# Patient Record
Sex: Female | Born: 1980 | Race: White | Hispanic: No | Marital: Single | Smoking: Former smoker
Health system: Southern US, Community
[De-identification: ages and names within clinical notes are randomized; demographics above are authoritative.]

## PROBLEM LIST (undated history)

## (undated) DIAGNOSIS — L4050 Arthropathic psoriasis, unspecified: Secondary | ICD-10-CM

## (undated) DIAGNOSIS — Z3490 Encounter for supervision of normal pregnancy, unspecified, unspecified trimester: Secondary | ICD-10-CM

## (undated) DIAGNOSIS — G35 Multiple sclerosis: Secondary | ICD-10-CM

## (undated) DIAGNOSIS — E669 Obesity, unspecified: Secondary | ICD-10-CM

## (undated) DIAGNOSIS — E1165 Type 2 diabetes mellitus with hyperglycemia: Secondary | ICD-10-CM

## (undated) DIAGNOSIS — M2550 Pain in unspecified joint: Secondary | ICD-10-CM

## (undated) DIAGNOSIS — R42 Dizziness and giddiness: Secondary | ICD-10-CM

## (undated) DIAGNOSIS — O24419 Gestational diabetes mellitus in pregnancy, unspecified control: Secondary | ICD-10-CM

## (undated) DIAGNOSIS — O139 Gestational [pregnancy-induced] hypertension without significant proteinuria, unspecified trimester: Secondary | ICD-10-CM

## (undated) HISTORY — DX: Pain in unspecified joint: M25.50

## (undated) HISTORY — DX: Dizziness and giddiness: R42

## (undated) HISTORY — DX: Multiple sclerosis: G35

## (undated) HISTORY — DX: Type 2 diabetes mellitus with hyperglycemia: E11.65

## (undated) HISTORY — DX: Arthropathic psoriasis, unspecified: L40.50

---

## 2000-10-14 ENCOUNTER — Emergency Department (HOSPITAL_COMMUNITY): Admission: EM | Admit: 2000-10-14 | Discharge: 2000-10-14 | Payer: Self-pay | Admitting: Emergency Medicine

## 2001-07-03 ENCOUNTER — Emergency Department (HOSPITAL_COMMUNITY): Admission: EM | Admit: 2001-07-03 | Discharge: 2001-07-03 | Payer: Self-pay | Admitting: Emergency Medicine

## 2002-04-08 ENCOUNTER — Ambulatory Visit (HOSPITAL_COMMUNITY): Admission: RE | Admit: 2002-04-08 | Discharge: 2002-04-08 | Payer: Self-pay | Admitting: Obstetrics and Gynecology

## 2002-04-08 ENCOUNTER — Encounter: Payer: Self-pay | Admitting: Obstetrics and Gynecology

## 2002-04-23 ENCOUNTER — Ambulatory Visit (HOSPITAL_COMMUNITY): Admission: AD | Admit: 2002-04-23 | Discharge: 2002-04-23 | Payer: Self-pay | Admitting: Obstetrics and Gynecology

## 2002-04-29 ENCOUNTER — Ambulatory Visit (HOSPITAL_COMMUNITY): Admission: AD | Admit: 2002-04-29 | Discharge: 2002-04-29 | Payer: Self-pay | Admitting: Obstetrics and Gynecology

## 2002-05-03 ENCOUNTER — Ambulatory Visit (HOSPITAL_COMMUNITY): Admission: AD | Admit: 2002-05-03 | Discharge: 2002-05-03 | Payer: Self-pay | Admitting: Obstetrics and Gynecology

## 2002-05-06 ENCOUNTER — Ambulatory Visit (HOSPITAL_COMMUNITY): Admission: AD | Admit: 2002-05-06 | Discharge: 2002-05-06 | Payer: Self-pay | Admitting: Obstetrics and Gynecology

## 2002-05-11 ENCOUNTER — Inpatient Hospital Stay (HOSPITAL_COMMUNITY): Admission: AD | Admit: 2002-05-11 | Discharge: 2002-05-15 | Payer: Self-pay | Admitting: Obstetrics & Gynecology

## 2005-07-24 ENCOUNTER — Ambulatory Visit (HOSPITAL_COMMUNITY): Admission: RE | Admit: 2005-07-24 | Discharge: 2005-07-24 | Payer: Self-pay | Admitting: Family Medicine

## 2005-08-23 ENCOUNTER — Ambulatory Visit (HOSPITAL_COMMUNITY): Admission: RE | Admit: 2005-08-23 | Discharge: 2005-08-23 | Payer: Self-pay | Admitting: Family Medicine

## 2006-11-19 ENCOUNTER — Other Ambulatory Visit: Admission: RE | Admit: 2006-11-19 | Discharge: 2006-11-19 | Payer: Self-pay | Admitting: Obstetrics and Gynecology

## 2007-06-14 ENCOUNTER — Emergency Department (HOSPITAL_COMMUNITY): Admission: EM | Admit: 2007-06-14 | Discharge: 2007-06-14 | Payer: Self-pay | Admitting: Emergency Medicine

## 2008-08-14 ENCOUNTER — Emergency Department (HOSPITAL_COMMUNITY): Admission: EM | Admit: 2008-08-14 | Discharge: 2008-08-14 | Payer: Self-pay | Admitting: Emergency Medicine

## 2009-10-29 ENCOUNTER — Emergency Department (HOSPITAL_COMMUNITY): Admission: EM | Admit: 2009-10-29 | Discharge: 2009-10-29 | Payer: Self-pay | Admitting: Emergency Medicine

## 2009-10-31 ENCOUNTER — Emergency Department (HOSPITAL_COMMUNITY): Admission: EM | Admit: 2009-10-31 | Discharge: 2009-10-31 | Payer: Self-pay | Admitting: Emergency Medicine

## 2009-12-29 ENCOUNTER — Emergency Department (HOSPITAL_COMMUNITY): Admission: EM | Admit: 2009-12-29 | Discharge: 2009-12-29 | Payer: Self-pay | Admitting: Emergency Medicine

## 2010-03-11 ENCOUNTER — Emergency Department (HOSPITAL_COMMUNITY)
Admission: EM | Admit: 2010-03-11 | Discharge: 2010-03-11 | Payer: Self-pay | Source: Home / Self Care | Admitting: Emergency Medicine

## 2010-05-10 LAB — HEPATIC FUNCTION PANEL
ALT: 39 U/L — ABNORMAL HIGH (ref 0–35)
AST: 23 U/L (ref 0–37)
Albumin: 4.2 g/dL (ref 3.5–5.2)
Alkaline Phosphatase: 79 U/L (ref 39–117)
Total Bilirubin: 0.8 mg/dL (ref 0.3–1.2)

## 2010-05-10 LAB — URINALYSIS, ROUTINE W REFLEX MICROSCOPIC
Bilirubin Urine: NEGATIVE
Bilirubin Urine: NEGATIVE
Glucose, UA: NEGATIVE mg/dL
Glucose, UA: NEGATIVE mg/dL
Ketones, ur: 15 mg/dL — AB
Ketones, ur: NEGATIVE mg/dL
Nitrite: POSITIVE — AB
Nitrite: POSITIVE — AB
Protein, ur: 30 mg/dL — AB
Protein, ur: NEGATIVE mg/dL
Specific Gravity, Urine: 1.015 (ref 1.005–1.030)
Specific Gravity, Urine: 1.02 (ref 1.005–1.030)
Urobilinogen, UA: 0.2 mg/dL (ref 0.0–1.0)
Urobilinogen, UA: 1 mg/dL (ref 0.0–1.0)
pH: 6 (ref 5.0–8.0)
pH: 6.5 (ref 5.0–8.0)

## 2010-05-10 LAB — BASIC METABOLIC PANEL
Calcium: 9.5 mg/dL (ref 8.4–10.5)
GFR calc Af Amer: >60 mL/min (ref >60)
GFR calc non Af Amer: >60 mL/min (ref >60)
Potassium: 3.9 mEq/L (ref 3.5–5.1)
Sodium: 137 mEq/L (ref 135–145)

## 2010-05-10 LAB — URINE MICROSCOPIC-ADD ON

## 2010-05-10 LAB — URINE CULTURE

## 2010-05-10 LAB — PREGNANCY, URINE: Preg Test, Ur: NEGATIVE

## 2010-05-10 LAB — DIFFERENTIAL
Basophils Absolute: 0 10*3/uL (ref 0.0–0.1)
Basophils Relative: 0 % (ref 0–1)
Eosinophils Absolute: 0.1 10*3/uL (ref 0.0–0.7)
Eosinophils Relative: 1 % (ref 0–5)
Lymphocytes Relative: 16 % (ref 12–46)
Lymphs Abs: 2 10*3/uL (ref 0.7–4.0)
Monocytes Absolute: 0.8 10*3/uL (ref 0.1–1.0)
Monocytes Relative: 6 % (ref 3–12)
Neutro Abs: 9.3 10*3/uL — ABNORMAL HIGH (ref 1.7–7.7)
Neutrophils Relative %: 76 % (ref 43–77)

## 2010-05-10 LAB — CBC
HCT: 43.1 % (ref 36.0–46.0)
Hemoglobin: 14.6 g/dL (ref 12.0–15.0)
MCH: 30.3 pg (ref 26.0–34.0)
MCHC: 33.8 g/dL (ref 30.0–36.0)
MCV: 89.7 fL (ref 78.0–100.0)
Platelets: 293 10*3/uL (ref 150–400)
RBC: 4.81 MIL/uL (ref 3.87–5.11)
RDW: 13.4 % (ref 11.5–15.5)
WBC: 12.2 10*3/uL — ABNORMAL HIGH (ref 4.0–10.5)

## 2010-05-10 LAB — LIPASE, BLOOD: Lipase: 25 U/L (ref 11–59)

## 2010-07-13 NOTE — Discharge Summary (Signed)
   NAME:  Morgan Allen, Morgan Allen                           ACCOUNT NO.:  [id number]   MEDICAL RECORD NO.:  [id number]                   PATIENT TYPE:  INP   LOCATION:  A428                                 FACILITY:  [facility]   PHYSICIAN:  Tilda Burrow, M.D.              DATE OF BIRTH:  1980/07/20   DATE OF ADMISSION:  05/11/2002  DATE OF DISCHARGE:  05/15/2002                                 DISCHARGE SUMMARY   ADMITTING DIAGNOSES:  1. Intrauterine pregnancy [redacted] weeks gestation.  2. Chronic hypertension.   DISCHARGE DIAGNOSES:  1. Intrauterine pregnancy [redacted] weeks gestation, delivered.  2. Chronic hypertension.  3. Failed medical induction of labor.   PROCEDURE:  1. On 05/12/2002, Pitocin induction of labor, failed.  2. Primary low transverse cervical cesarean section by Lazaro Arms, M.D.   DISCHARGE MEDICATIONS:  1. Tylox 1-2 q.4h. p.r.n. pain, dispensed 30.  2. Motrin 800 mg 1 q.8h. p.r.n. pain, dispensed 30.   DISCHARGE INSTRUCTIONS:  Follow up in 5 days for staple removal.   HISTORY OF PRESENT ILLNESS:  This 30 year old female, gravida 1, para 0 was  admitted by Dr. Despina Hidden on 05/11/2002, for hypertension, with the cervix thick,  soft, and minus 2, soft in mid plane, 40 cm fundal height with a pregnancy  notable for chronic hypertension and morbid obesity, with the patient's  weight being 295 on a 5 foot 1 frame.  The infant had short femurs on  ultrasound just like the mother did.  Prenatal care was limited after a  February initial contact, establishment of prenatal care.   HOSPITAL COURSE:  The patient was admitted, had evaluation of the cervix  after Foley bulb ripening over night. The cervix was 3 cm and stayed that  way all day.  Disproportion, failed induction was diagnosed and he proceeded  with cesarean section at 5:30 p.m. delivering a healthy infant with Apgars  of 8 and 9.  Estimated blood loss was 1000 mL.   Postoperatively, the patient did excellently with prompt  ambulation.  Hemoglobin postoperatively was 9.0, hematocrit 26.4 consistent with the  suspected blood loss. This was compared to admitting hemoglobin of 12.7 and  hematocrit 37.0.  Maternal blood type is O positive.  Follow up will be in 5  days for staple removal and subsequently as necessary.                                               Tilda Burrow, M.D.    JVF/MEDQ  D:  05/15/2002  T:  05/16/2002  Job:  [id number]

## 2010-07-13 NOTE — H&P (Signed)
   NAME:  Morgan Allen, Morgan Allen                           ACCOUNT NO.:  [id number]   MEDICAL RECORD NO.:  [id number]                   PATIENT TYPE:  INP   LOCATION:  LDR4                                 FACILITY:  [facility]   PHYSICIAN:  Lazaro Arms, M.D.                DATE OF BIRTH:  1980/12/02   DATE OF ADMISSION:  05/11/2002  DATE OF DISCHARGE:                                HISTORY & PHYSICAL   HISTORY OF PRESENT ILLNESS:  The patient is a 30 year old white female,  gravida 1, para 0, abortus 0, with an estimated date of delivery of May 11, 2002, by her last menstrual period of August 03, 2001, and a confirmatory  ultrasound at [redacted] weeks gestation.  The patient was late presentation.  Her  first visit was at [redacted] weeks gestation.  She was found to be hypertension and  she was started on Aldomet 500 mg b.i.d.  She has no proteinuria.  Ultrasound in the office revealed short femurs and that was confirmed at  [facility].  There were no other specific anatomical abnormalities.  Because of her chronic hypertension, the patient is admitted for cervical  ripening with a Foley bulb  followed by Pitocin induction tomorrow.   PAST MEDICAL HISTORY:  Chronic hypertension.   PAST SURGICAL HISTORY:  Negative.   OBSTETRICAL HISTORY:  She is nulliparous.   ALLERGIES:  None.   MEDICATIONS:  Prenatal vitamins and the Aldomet as stated above.   FAMILY HISTORY:  Significant for diabetes and hypertension.   LABORATORY DATA:  Her blood type is O+.  Rubella is immune.  The antibody  screen is negative.  HIV, hepatitis B, and serology are all negative.  Her  Pap was normal.  GC and chlamydia were negative.  Her group B Streptococcus  was positive.  She has had twice weekly NSTs which have been normal.   PHYSICAL EXAMINATION:  HEENT:  Unremarkable.  NECK:  The thyroid is normal.  LUNGS:  Clear.  BREASTS:  Deferred.  ABDOMEN:  The fundal height is 40 cm.  PELVIC:  The cervix is fingertip, thick,  about a -2, soft, and midplane.  EXTREMITIES:  Warm with 1+ edema.   IMPRESSION:  1. Intrauterine pregnancy at [redacted] weeks gestation.  2. Chronic hypertension.  3. Morbid obesity.   PLAN:  The patient is admitted for Foley bulb ripening following Pitocin  induction.  She understands the indications and will proceed.                                               Lazaro Arms, M.D.    Loraine Maple  D:  05/11/2002  T:  05/11/2002  Job:  [id number]

## 2010-07-13 NOTE — Op Note (Signed)
NAME:  Morgan Allen, Morgan Allen                           ACCOUNT NO.:  [id number]   MEDICAL RECORD NO.:  [id number]                   PATIENT TYPE:  INP   LOCATION:  A428                                 FACILITY:  [facility]   PHYSICIAN:  Lazaro Arms, M.D.                DATE OF BIRTH:  [birth year]   DATE OF PROCEDURE:  05/12/2002  DATE OF DISCHARGE:                                 OPERATIVE REPORT   PREOPERATIVE DIAGNOSES:  1. Intrauterine pregnancy at [redacted] weeks gestation.  2. Chronic hypertension.  3. Failed induction.   POSTOPERATIVE DIAGNOSES:  1. Intrauterine pregnancy at [redacted] weeks gestation.  2. Chronic hypertension.  3. Failed induction.   PROCEDURE:  Primary low transverse cesarean section.   SURGEON:  Lazaro Arms, M.D.   ANESTHESIA:  Spinal.   FINDINGS:  The patient was admitted last night for induction because of  chronic hypertension at 40 weeks.  She underwent an amniotomy this morning.  She was 3 cm and stayed that all day.  She got 30 MU per minute of Pitocin.  Because of the dysfunctional nature of her labor and no change, it was  decided to proceed with a primary low transverse cesarean section.  She had  a spinal performed.  She delivered a viable female infant at 68 with  Apgars to be determined in the nursery.  Cord gas was drawn.  Cord blood was  drawn.  The weight was to be determined in the nursery as well.  The only  complication of the procedure was a scalp electrode in the IUPC were left  on, and they were removed at the time after the uterus was opened.  I  actually had to cut the scalp electrode and pulled a little nub of it off,  but we accounted for it.  The IUPC was taken out during the case.   DESCRIPTION OF PROCEDURE:  The patient was taken to the operating room and  placed in the sitting and was given spinal anesthetic.  She was then placed  in the supine position and prepped and draped in the usual sterile fashion.   A Pfannenstiel skin incision  was made and carried down sharply to the rectus  fascia which was scored in the midline and extended laterally.  The fascia  was taken off of the muscle superiorly and inferiorly without difficulty.  The muscles were divided.  The peritoneal cavity was entered.  The bladder  blade was placed.  The vesicouterine serosal flap was created.  A low  transverse hysterotomy incision was made, and over this incision was  delivered a viable female infant at 62 with weight and Apgars to be  determined in the nursery.  Dr. Tillman Abide was in attendance for routine neonatal  resuscitation.  The placenta was delivered spontaneously.  The uterus was  somewhat boggy.  After delivery, she required Methergine.  It was  firmed up  by the end of the case.  She did have more blood loss, not from the uterine  incision but from the uterus itself due to this fact.  The uterus was closed  in two layers, the first being a running interlocking layer and the second  being an embrocating layer.  There was good hemostasis of the uterine  incision.  The pelvis was irrigated vigorously using warm normal saline.  All pedicles were found to be hemostatic.  The muscles were reapproximated  loosely.  The fascia was closed using 0 Vicryl running.  The subcutaneous  tissue was irrigated and made hemostatic.  She received Ancef  prophylactically.  The skin was closed using skin staples.  The patient  tolerated the procedure well.  She experienced 1000 cc of blood loss and was  taken to the recovery room in good and stable condition.  All counts were  correct x3.                                               Lazaro Arms, M.D.    Loraine Maple  D:  05/12/2002  T:  05/13/2002  Job:  [id number]

## 2014-02-25 NOTE — L&D Delivery Note (Signed)
Operative Delivery Note At 8:40 AM a non-viable female was delivered via VBAC, Spontaneous.  Presentation: vertex; Position: Right,, Occiput,, Anterior.   Delivery of the head: 09/18/2014  8:10 AM. CNM attempted delivery via downward traction of head, but was unsuccessful after 3 attempts. Dx shoulder dystocia.   First maneuver: 09/18/2014  8:15 AM, Suprapubic Pressure, McRoberts performed w/ CNM. CNM attempted delivery of posterior arm, but was unable to reach. Attending called. [name] came to [facility]. Attempted delivery of posterior arm. Unable. Dr. Debroah Loop called. Second maneuver: , Posterior arm delivered By [name] MD. Fundal pressure applied by CNM. Second arm delivered. Body delivered slowly w/ difficulty.  Third maneuver: 09/18/2014  8:38 AM,    APGAR: 0, 0; weight 8 lb 9 oz .   Placenta status: Intact, Spontaneous.   Cord: 3 vessels with the following complications: None.  Cord pH: NA  Anesthesia: Epidural  Episiotomy: None Lacerations: None Suture Repair: NA Est. Blood Loss (mL): 150  Mom to postpartum.  Baby to [place]. Family undecided about autopsy.   Morgan Allen 09/18/2014, 9:34 AM

## 2014-03-18 LAB — OB RESULTS CONSOLE HGB/HCT, BLOOD
HCT: 39 %
HEMOGLOBIN: 13.4 g/dL

## 2014-03-18 LAB — US OB LIMITED

## 2014-03-18 LAB — OB RESULTS CONSOLE HEPATITIS B SURFACE ANTIGEN: Hepatitis B Surface Ag: NEGATIVE

## 2014-03-18 LAB — OB RESULTS CONSOLE ABO/RH: RH Type: POSITIVE

## 2014-03-18 LAB — OB RESULTS CONSOLE RPR: RPR: NONREACTIVE

## 2014-03-18 LAB — OB RESULTS CONSOLE ANTIBODY SCREEN: ANTIBODY SCREEN: NEGATIVE

## 2014-03-18 LAB — OB RESULTS CONSOLE PLATELET COUNT: Platelets: 273 10*3/uL

## 2014-03-18 LAB — SICKLE CELL SCREEN: SICKLE CELL SCREEN: NEGATIVE

## 2014-03-18 LAB — OB RESULTS CONSOLE RUBELLA ANTIBODY, IGM: RUBELLA: IMMUNE

## 2014-03-21 LAB — HEMOGLOBIN A1C: HEMOGLOBIN A1C: 6.4

## 2014-03-21 LAB — CYTOLOGY - PAP
HPV DNA: NOT DETECTED
PAP SMEAR: NEGATIVE

## 2014-04-08 LAB — US OB FOLLOW UP

## 2014-05-12 ENCOUNTER — Emergency Department (HOSPITAL_COMMUNITY)
Admission: EM | Admit: 2014-05-12 | Discharge: 2014-05-12 | Disposition: A | Discharge status: Home / Self Care | Payer: BLUE CROSS/BLUE SHIELD | Attending: Emergency Medicine | Admitting: Emergency Medicine

## 2014-05-12 ENCOUNTER — Encounter (HOSPITAL_COMMUNITY): Payer: Self-pay

## 2014-05-12 DIAGNOSIS — Z9889 Other specified postprocedural states: Secondary | ICD-10-CM | POA: Insufficient documentation

## 2014-05-12 DIAGNOSIS — O99350 Diseases of the nervous system complicating pregnancy, unspecified trimester: Secondary | ICD-10-CM | POA: Insufficient documentation

## 2014-05-12 DIAGNOSIS — M79602 Pain in left arm: Secondary | ICD-10-CM | POA: Insufficient documentation

## 2014-05-12 DIAGNOSIS — Z3A00 Weeks of gestation of pregnancy not specified: Secondary | ICD-10-CM | POA: Diagnosis not present

## 2014-05-12 DIAGNOSIS — O9989 Other specified diseases and conditions complicating pregnancy, childbirth and the puerperium: Secondary | ICD-10-CM | POA: Diagnosis present

## 2014-05-12 DIAGNOSIS — G629 Polyneuropathy, unspecified: Secondary | ICD-10-CM | POA: Diagnosis not present

## 2014-05-12 DIAGNOSIS — Z87891 Personal history of nicotine dependence: Secondary | ICD-10-CM | POA: Diagnosis not present

## 2014-05-12 DIAGNOSIS — Z79899 Other long term (current) drug therapy: Secondary | ICD-10-CM | POA: Insufficient documentation

## 2014-05-12 HISTORY — DX: Encounter for supervision of normal pregnancy, unspecified, unspecified trimester: Z34.90

## 2014-05-12 NOTE — ED Provider Notes (Signed)
CSN: [id number]     Arrival date & time 05/12/14  3244 History   First MD Initiated Contact with Patient 05/12/14 [phone]     Chief Complaint  Patient presents with  . Arm Pain     (Consider location/radiation/quality/duration/timing/severity/associated sxs/prior Treatment) Patient is a 34 y.o. female presenting with arm pain. The history is provided by the patient.  Arm Pain Pertinent negatives include no chest pain, no abdominal pain, no headaches and no shortness of breath.   patient is pregnant. Due date is in August. Patient followed by OB/GYN in [place]. Patient with complaint of one-week history of intermittent left arm numbness that doesn't involve just the ulnar nerve but seems to involve median and radial nerve as well. Ends to occur while sleeping. Upon awakening it resolves over 10 minutes. Also same thing occurs with the left femoral nerve distribution. No other of neuro focal deficits nothing permanent. No visual changes. No significant headaches.  Past Medical History  Diagnosis Date  . Pregnant    Past Surgical History  Procedure Laterality Date  . Cesarean section     No family history on file. History  Substance Use Topics  . Smoking status: Former Games developer  . Smokeless tobacco: Not on file  . Alcohol Use: No   OB History    Gravida Para Term Preterm AB TAB SAB Ectopic Multiple Living   1              Review of Systems  Constitutional: Negative for fever.  HENT: Negative for congestion.   Eyes: Negative for visual disturbance.  Respiratory: Negative for shortness of breath.   Cardiovascular: Negative for chest pain.  Gastrointestinal: Negative for abdominal pain.  Genitourinary: Negative for dysuria.  Musculoskeletal: Negative for back pain and neck pain.  Skin: Negative for rash.  Neurological: Positive for numbness. Negative for dizziness, facial asymmetry, speech difficulty, weakness and headaches.  Hematological: Does not bruise/bleed easily.   Psychiatric/Behavioral: Negative for confusion.      Allergies  Review of patient's allergies indicates no known allergies.  Home Medications   Prior to Admission medications   Medication Sig Start Date End Date Taking? Authorizing Provider  Prenatal Vit-Fe Fumarate-FA (PRENATAL MULTIVITAMIN) TABS tablet Take 1 tablet by mouth daily at 12 noon.   Yes Historical Provider, MD   BP 146/94 mmHg  Pulse 96  Temp(Src) 97.9 F (36.6 C)  Resp 16  Ht 5\' 2"  (1.575 m)  Wt 297 lb (134.718 kg)  BMI 54.31 kg/m2  SpO2 100%  LMP 12/29/2013 Physical Exam  Constitutional: She is oriented to person, place, and time. She appears well-developed and well-nourished. No distress.  HENT:  Head: Normocephalic and atraumatic.  Mouth/Throat: Oropharynx is clear and moist.  Eyes: Conjunctivae and EOM are normal. Pupils are equal, round, and reactive to light.  Neck: Normal range of motion.  Cardiovascular: Normal rate, regular rhythm and normal heart sounds.   Pulmonary/Chest: Effort normal and breath sounds normal. No respiratory distress.  Abdominal: Soft. Bowel sounds are normal. There is no tenderness.  Musculoskeletal: Normal range of motion.  Neurological: She is alert and oriented to person, place, and time. No cranial nerve deficit. She exhibits normal muscle tone. Coordination normal.  Skin: Skin is warm. No rash noted.  Nursing note and vitals reviewed.   ED Course  Procedures (including critical care time) Labs Review Labs Reviewed - No data to display  Imaging Review No results found.   EKG Interpretation None      MDM  Final diagnoses:  Neuropathy    Symptoms more consistent with a neuropraxia versus neuropathy. Doubt that it's MS related. With unusual as it with the left arm it seems to involve all 3 nerves. But does recover about 10 minutes upon awakening. May be related to pregnancy. Patient will follow-up with her OB/GYN and discussed the situation with them in  addition the referral to neurology provided. No concerns for stroke. Symptoms are transient and tendo occur with sleep. Also involves the left femoral nerve and the median ulnar radial nerve of the left hand. No motor weakness. Work note provided.  Patient's blood pressure is somewhat elevated. Do not think that this is a preeclampsia picture. Patient's due date is August 12. Which is not putting her into the window of a preeclampsia picture. Close follow-up of blood pressure will be important.    Vanetta Mulders, MD 05/12/14 [phone]

## 2014-05-12 NOTE — Discharge Instructions (Signed)
Has we discussed follow-up with your OB/GYN they may have an explanation. In addition referral to neurology provided. Sounds as if there is nerve compression to all 3 nerves in the left arm which may be occurring at the brachial plexus level. The left leg seems to be more of a femoral nerve problem. Return for any new or worse symptoms.

## 2014-05-12 NOTE — ED Notes (Signed)
Pain in left arm from elbow to fingers with some numbness to fingertips, states happens more often when she is lying down, has been worse this am.

## 2014-06-07 ENCOUNTER — Other Ambulatory Visit (HOSPITAL_COMMUNITY): Payer: Self-pay | Admitting: Obstetrics & Gynecology

## 2014-06-07 DIAGNOSIS — Z0489 Encounter for examination and observation for other specified reasons: Secondary | ICD-10-CM

## 2014-06-07 DIAGNOSIS — O09522 Supervision of elderly multigravida, second trimester: Secondary | ICD-10-CM

## 2014-06-07 DIAGNOSIS — IMO0002 Reserved for concepts with insufficient information to code with codable children: Secondary | ICD-10-CM

## 2014-06-10 ENCOUNTER — Ambulatory Visit (HOSPITAL_COMMUNITY)
Admission: RE | Admit: 2014-06-10 | Discharge: 2014-06-10 | Disposition: A | Discharge status: Home / Self Care | Payer: BLUE CROSS/BLUE SHIELD | Source: Ambulatory Visit | Attending: Obstetrics & Gynecology | Admitting: Obstetrics & Gynecology

## 2014-06-10 ENCOUNTER — Other Ambulatory Visit (HOSPITAL_COMMUNITY): Payer: Self-pay | Admitting: Obstetrics & Gynecology

## 2014-06-10 ENCOUNTER — Other Ambulatory Visit (HOSPITAL_COMMUNITY): Payer: Self-pay | Admitting: Maternal and Fetal Medicine

## 2014-06-10 ENCOUNTER — Encounter (HOSPITAL_COMMUNITY): Payer: Self-pay

## 2014-06-10 DIAGNOSIS — O09522 Supervision of elderly multigravida, second trimester: Secondary | ICD-10-CM

## 2014-06-10 DIAGNOSIS — Z315 Encounter for genetic counseling: Secondary | ICD-10-CM | POA: Diagnosis not present

## 2014-06-10 DIAGNOSIS — Z3A22 22 weeks gestation of pregnancy: Secondary | ICD-10-CM | POA: Diagnosis not present

## 2014-06-10 DIAGNOSIS — Z0489 Encounter for examination and observation for other specified reasons: Secondary | ICD-10-CM

## 2014-06-10 DIAGNOSIS — O09529 Supervision of elderly multigravida, unspecified trimester: Secondary | ICD-10-CM | POA: Insufficient documentation

## 2014-06-10 DIAGNOSIS — Z3689 Encounter for other specified antenatal screening: Secondary | ICD-10-CM | POA: Insufficient documentation

## 2014-06-10 DIAGNOSIS — IMO0002 Reserved for concepts with insufficient information to code with codable children: Secondary | ICD-10-CM

## 2014-06-10 NOTE — Progress Notes (Signed)
Genetic Counseling  High-Risk Gestation Note  Appointment Date:  06/10/2014 Referred By: Kingsley Plan, MD Date of Birth:  June 21, 1980 Partner:  Morgan Allen   Pregnancy History: [id number] Estimated Date of Delivery: 10/11/14 Estimated Gestational Age: [redacted]w[redacted]d Attending: Alpha Gula, MD  [name] and her partner, Morgan Allen, were seen for genetic counseling because of a maternal age of 34 y.o..  She will be 34 years old at delivery.   She was counseled regarding maternal age and the association with risk for chromosome conditions due to nondisjunction with aging of the ova.   We reviewed chromosomes, nondisjunction, and the associated 1 in 141 risk for fetal aneuploidy related to a maternal age of 34 y.o. at [redacted]w[redacted]d gestation.  They were counseled that the risk for aneuploidy decreases as gestational age increases, accounting for those pregnancies which spontaneously abort.  We specifically discussed Down syndrome (trisomy 55), trisomies 45 and 20, and sex chromosome aneuploidies (47,XXX and 47,XXY) including the common features and prognoses of each.   Morgan Allen previously had Quad screen performed through her OB office, which was within normal limits for the conditions screened. We reviewed the reduced risks for fetal Down syndrome (1 in 7127), trisomy 18, and ONTDs (1 in 6916). We reviewed that screening tests are used to modify a patient's a priori risk for aneuploidy, typically based on age. This provides a pregnancy specific risk assessment but is not diagnostic.   We reviewed additional available screening options including noninvasive prenatal screening (NIPS)/cell free DNA (cfDNA) testing and detailed ultrasound.  We reviewed the benefits and limitations of each option. Specifically, we discussed the conditions for which each test screens, the detection rates, and false positive rates of each. They were also counseled regarding diagnostic testing via amniocentesis. We reviewed  the approximate 1 in 300-500 risk for complications for amniocentesis, including spontaneous pregnancy loss. After consideration of all the options, they elected to proceed with targeted ultrasound only. The patient declined NIPS and amniocentesis in the pregnancy.    A complete ultrasound was performed today. The ultrasound report will be sent under separate cover. There were no visualized fetal anomalies or markers suggestive of aneuploidy. They understand that screening tests cannot rule out all birth defects or genetic syndromes. The patient was advised of this limitation and states she still does not want additional testing at this time. Follow-up ultrasound was scheduled for 07/08/14 to complete fetal anatomic survey.   Morgan Allen was provided with written information regarding cystic fibrosis (CF) including the carrier frequency and incidence in the Caucasian population, the availability of carrier testing and prenatal diagnosis if indicated.  In addition, we discussed that CF is routinely screened for as part of the [place] newborn screening panel.  She previously had CF carrier screening, which was negative for the 97 mutations analyzed. Thus, her risk to be a CF carrier has been reduced.   Both family histories were reviewed and found to be noncontributory for birth defects, intellectual disability, and known genetic conditions. Without further information regarding the provided family history, an accurate genetic risk cannot be calculated. Further genetic counseling is warranted if more information is obtained.  Morgan Allen denied exposure to environmental toxins or chemical agents. She denied the use of alcohol, tobacco or street drugs. She denied significant viral illnesses during the course of her pregnancy. Her medical and surgical histories were noncontributory.   I counseled this couple regarding the above risks and available options.  The approximate face-to-face  time with the genetic  counselor was 30 minutes.  Morgan Plowman, MS,  Certified Genetic Counselor 06/10/2014

## 2014-07-08 ENCOUNTER — Other Ambulatory Visit (HOSPITAL_COMMUNITY): Payer: Self-pay | Admitting: Maternal and Fetal Medicine

## 2014-07-08 ENCOUNTER — Ambulatory Visit (HOSPITAL_COMMUNITY)
Admission: RE | Admit: 2014-07-08 | Discharge: 2014-07-08 | Disposition: A | Discharge status: Home / Self Care | Payer: BLUE CROSS/BLUE SHIELD | Source: Ambulatory Visit | Attending: Obstetrics & Gynecology | Admitting: Obstetrics & Gynecology

## 2014-07-08 ENCOUNTER — Encounter (HOSPITAL_COMMUNITY): Payer: Self-pay

## 2014-07-08 DIAGNOSIS — E119 Type 2 diabetes mellitus without complications: Secondary | ICD-10-CM | POA: Diagnosis not present

## 2014-07-08 DIAGNOSIS — Z3A26 26 weeks gestation of pregnancy: Secondary | ICD-10-CM | POA: Insufficient documentation

## 2014-07-08 DIAGNOSIS — O99212 Obesity complicating pregnancy, second trimester: Secondary | ICD-10-CM | POA: Insufficient documentation

## 2014-07-08 DIAGNOSIS — O10012 Pre-existing essential hypertension complicating pregnancy, second trimester: Secondary | ICD-10-CM | POA: Insufficient documentation

## 2014-07-08 DIAGNOSIS — O09522 Supervision of elderly multigravida, second trimester: Secondary | ICD-10-CM

## 2014-07-08 DIAGNOSIS — O24112 Pre-existing diabetes mellitus, type 2, in pregnancy, second trimester: Secondary | ICD-10-CM | POA: Insufficient documentation

## 2014-07-08 HISTORY — DX: Gestational diabetes mellitus in pregnancy, unspecified control: O24.419

## 2014-07-08 NOTE — ED Notes (Signed)
Prescription for Metformin  BID and Glyburide  BID called to [facility] in [place], [place].

## 2014-07-08 NOTE — ED Notes (Signed)
Blood sugar log copied, scanned to chart and reviewed by Dr. Claudean Severance.

## 2014-07-08 NOTE — Consult Note (Signed)
Maternal Fetal Medicine Consultation  Requesting Provider(s): Kingsley Plan, MD  Reason for consultation: Pre-existing type 2 diabetes, chronic hypertension  HPI: Morgan Allen is a 34 yo G2P1001, EDD 10/11/2014 who is currently at [redacted]w[redacted]d seen for consultation today due to a history of pre-existing type 2 diabetes.  Morgan Allen started her OB care in [place] and was previously followed by MFM there.  Morgan Allen reports a history of type 2 diabetes that was first diagnosed in 9.  At the time of her diagnosis, she had a Hemoglobin A1C of 13%.  She was initially treated with insulin - after weight loss and dietary interventions, her blood sugars were much better controlled and she no longer required insulin.  Her initial HbA1C in [place] was 6.4% and she did not previously require therapy.  Morgan Allen has been following her fingerstick values - over the last week, essentially all her values have been outside of target range with fasting values of 120, 128, 126, and 119.  All her post prandial values are elevated in the 140-220 range.  Morgan Allen did not have baseline labs performed and does not recall the last time that she had an evaluation of her eye-grounds.  She is without complaints today.  The fetus is active.  OB History: OB History    Gravida Para Term Preterm AB TAB SAB Ectopic Multiple Living   Previous C-section for arrest of dilation  PMH:  Past Medical History  Diagnosis Date  . Pregnant   . Gestational diabetes   . Hypertension     PSH:  Past Surgical History  Procedure Laterality Date  . Cesarean section     Meds:  Current Outpatient Prescriptions on File Prior to Encounter  Medication Sig Dispense Refill  . acetaminophen (TYLENOL) 325 MG tablet Take 650 mg by mouth every 6 (six) hours as needed.    . BusPIRone HCl (BUSPAR PO) Take by mouth.    . Prenatal Vit-Fe Fumarate-FA (PRENATAL MULTIVITAMIN) TABS tablet Take 1 tablet by mouth daily at 12 noon.      No current facility-administered medications on file prior to encounter.   Allergies: No Known Allergies   FH: Father - diabetes; Mother - sclerodema.  Denies birth defects or hereditary disorders  Soc:  Denies tobacco or ETOH use   Review of Systems: no vaginal bleeding or cramping/contractions, no LOF, no nausea/vomiting. All other systems reviewed and are negative.   PE:  300 lbs, 130/77, 98  GEN: well-appearing female ABD: gravid, NT  Ultrasound: Single IUP at 26w 3d Obesity, CHTN, pre-existing type 2 diabetes The estimated fetal weight today is at the 89th %tile.  The AC measures > 97th %tile. Limited views of the fetal heart and cranial anatomy  were again obtained due to poor ultrasound penetration. Anterior placenta without previa Normal amniotic fluid volume  A/P: 1) Single IUP at 26w 3d  2) Pre-existing type 2 diabetes - fingerstick values all elevated.  Ultrasound findings suggestive of diabetes (large AC, estimated fetal weight at the 89th %tile).  My recommendation is to start insulin therapy as now.  The patient reported that her insurance will not cover insulin- her co-pay is high and that she is unable to afford insulin therapy.  Because of this, I am willing to give a trial of oral hypoglycemic agents for 1-2 weeks, but feel that she will ultimately need to start insulin.  Will begin Metformin 500  mg BID and Glyburide 5 mg BID.  The patient will return to meet with our diabetic educator in 1-2 weeks.  Will review her fingerstick values at that time and start insulin then if needed.  3) Chronic hypertension  4) Obesity  Recommendations: 1) Will begin oral agents now (see above), but I am not optimistic that she will be able to achieve blood sugars in the desired range with these agents alone. 2) Recommend HbA1C, 24-hr urine protein with creatine clearance, and CMP to be drawn at her next OB follow up visit. 3) Optho exam to evaluate eye-grounds as baseline  evaluation was never completed 4) Fetal echo due to pre-gestational diabetes - scheduled 5) Ultrasound for growth in 4 weeks- patient to return in 4 weeks for follow up growth and to complete anatomy 6) Diabetic education (scheduled) 7) Recommend antenatal testing beginning at 32 weeks - 2x weekly NSTs with weekly AFIs. 8) Serial growth ultrasounds every 4 weeks due to diabetes   Thank you for the opportunity to be a part of the care of Morgan Allen. Please contact our office if we can be of further assistance.   I spent approximately 30 minutes with this patient with over 50% of time spent in face-to-face counseling.  Alpha Gula, MD Maternal Fetal Medicine

## 2014-07-12 ENCOUNTER — Other Ambulatory Visit (HOSPITAL_COMMUNITY): Payer: Self-pay | Admitting: Obstetrics & Gynecology

## 2014-07-12 ENCOUNTER — Encounter (HOSPITAL_COMMUNITY): Payer: Self-pay | Admitting: Obstetrics & Gynecology

## 2014-07-14 ENCOUNTER — Ambulatory Visit (HOSPITAL_COMMUNITY): Payer: BLUE CROSS/BLUE SHIELD

## 2014-07-19 ENCOUNTER — Other Ambulatory Visit (HOSPITAL_COMMUNITY): Payer: Self-pay | Admitting: Obstetrics & Gynecology

## 2014-07-27 ENCOUNTER — Encounter (HOSPITAL_COMMUNITY): Payer: Self-pay | Admitting: Obstetrics & Gynecology

## 2014-07-27 ENCOUNTER — Other Ambulatory Visit (HOSPITAL_COMMUNITY): Payer: Self-pay | Admitting: Obstetrics & Gynecology

## 2014-08-05 ENCOUNTER — Ambulatory Visit (HOSPITAL_COMMUNITY): Payer: BLUE CROSS/BLUE SHIELD | Attending: Obstetrics & Gynecology

## 2014-08-08 ENCOUNTER — Telehealth (HOSPITAL_COMMUNITY): Payer: Self-pay | Admitting: *Deleted

## 2014-08-08 NOTE — Telephone Encounter (Signed)
Pt called requesting a refill for diabetic medications.  Returned call to pt.  Pt at lunch, will attempt to call pt again.

## 2014-08-11 ENCOUNTER — Telehealth (HOSPITAL_COMMUNITY): Payer: Self-pay | Admitting: *Deleted

## 2014-08-11 NOTE — Telephone Encounter (Signed)
Attempted to call pt regarding request for refill of diabetic medications.  Pt not available at number given [phone]).  Left message for pt to call back.

## 2014-08-16 ENCOUNTER — Ambulatory Visit (HOSPITAL_COMMUNITY): Payer: BLUE CROSS/BLUE SHIELD

## 2014-08-23 ENCOUNTER — Observation Stay (HOSPITAL_COMMUNITY)
Admission: AD | Admit: 2014-08-23 | Discharge: 2014-08-23 | Discharge status: Against Medical Advice | Payer: BLUE CROSS/BLUE SHIELD | Source: Ambulatory Visit | Attending: Family Medicine | Admitting: Family Medicine

## 2014-08-23 ENCOUNTER — Other Ambulatory Visit (HOSPITAL_COMMUNITY): Payer: Self-pay | Admitting: Maternal and Fetal Medicine

## 2014-08-23 ENCOUNTER — Encounter (HOSPITAL_COMMUNITY): Payer: Self-pay | Admitting: Family Medicine

## 2014-08-23 ENCOUNTER — Encounter (HOSPITAL_COMMUNITY): Payer: Self-pay

## 2014-08-23 ENCOUNTER — Encounter (HOSPITAL_COMMUNITY): Payer: Self-pay | Admitting: *Deleted

## 2014-08-23 ENCOUNTER — Ambulatory Visit (HOSPITAL_COMMUNITY)
Admission: RE | Admit: 2014-08-23 | Discharge: 2014-08-23 | Disposition: A | Discharge status: Home / Self Care | Payer: BLUE CROSS/BLUE SHIELD | Source: Ambulatory Visit | Attending: Obstetrics & Gynecology | Admitting: Obstetrics & Gynecology

## 2014-08-23 DIAGNOSIS — O24119 Pre-existing diabetes mellitus, type 2, in pregnancy, unspecified trimester: Secondary | ICD-10-CM | POA: Insufficient documentation

## 2014-08-23 DIAGNOSIS — Z3A34 34 weeks gestation of pregnancy: Secondary | ICD-10-CM

## 2014-08-23 DIAGNOSIS — O139 Gestational [pregnancy-induced] hypertension without significant proteinuria, unspecified trimester: Secondary | ICD-10-CM | POA: Diagnosis present

## 2014-08-23 DIAGNOSIS — O09522 Supervision of elderly multigravida, second trimester: Secondary | ICD-10-CM

## 2014-08-23 DIAGNOSIS — O133 Gestational [pregnancy-induced] hypertension without significant proteinuria, third trimester: Secondary | ICD-10-CM

## 2014-08-23 DIAGNOSIS — O169 Unspecified maternal hypertension, unspecified trimester: Principal | ICD-10-CM | POA: Insufficient documentation

## 2014-08-23 DIAGNOSIS — Z3A33 33 weeks gestation of pregnancy: Secondary | ICD-10-CM | POA: Insufficient documentation

## 2014-08-23 HISTORY — DX: Obesity, unspecified: E66.9

## 2014-08-23 HISTORY — DX: Gestational (pregnancy-induced) hypertension without significant proteinuria, unspecified trimester: O13.9

## 2014-08-23 LAB — CBC
HEMATOCRIT: 39.5 % (ref 36.0–46.0)
HEMOGLOBIN: 14.2 g/dL (ref 12.0–15.0)
MCH: 30.9 pg (ref 26.0–34.0)
MCHC: 35.9 g/dL (ref 30.0–36.0)
MCV: 85.9 fL (ref 78.0–100.0)
Platelets: 337 10*3/uL (ref 150–400)
RBC: 4.6 MIL/uL (ref 3.87–5.11)
RDW: 12.8 % (ref 11.5–15.5)
WBC: 11.2 10*3/uL — ABNORMAL HIGH (ref 4.0–10.5)

## 2014-08-23 LAB — COMPREHENSIVE METABOLIC PANEL
ALT: 24 U/L (ref 14–54)
ANION GAP: 5 (ref 5–15)
AST: 26 U/L (ref 15–41)
Albumin: 2.6 g/dL — ABNORMAL LOW (ref 3.5–5.0)
Alkaline Phosphatase: 114 U/L (ref 38–126)
BILIRUBIN TOTAL: 0.2 mg/dL — AB (ref 0.3–1.2)
BUN: 15 mg/dL (ref 6–20)
CALCIUM: 8.9 mg/dL (ref 8.9–10.3)
CO2: 19 mmol/L — AB (ref 22–32)
Chloride: 112 mmol/L — ABNORMAL HIGH (ref 101–111)
Creatinine, Ser: 0.89 mg/dL (ref 0.44–1.00)
GFR calc non Af Amer: >60 mL/min (ref >60)
Glucose, Bld: 62 mg/dL — ABNORMAL LOW (ref 65–99)
POTASSIUM: 3.8 mmol/L (ref 3.5–5.1)
SODIUM: 136 mmol/L (ref 135–145)
Total Protein: 6.9 g/dL (ref 6.5–8.1)

## 2014-08-23 LAB — PROTEIN / CREATININE RATIO, URINE
Creatinine, Urine: 242 mg/dL
PROTEIN CREATININE RATIO: 0.29 mg/mg{creat} — AB (ref 0.00–0.15)
TOTAL PROTEIN, URINE: 69 mg/dL

## 2014-08-23 MED ORDER — ACETAMINOPHEN 325 MG PO TABS: 650.0000 mg | ORAL_TABLET | Freq: Four times a day (QID) | ORAL | Status: DC | PRN

## 2014-08-23 MED ORDER — DOCUSATE SODIUM 100 MG PO CAPS: 100.0000 mg | ORAL_CAPSULE | Freq: Every day | ORAL | Status: DC

## 2014-08-23 MED ORDER — ZOLPIDEM TARTRATE 5 MG PO TABS: 5.0000 mg | ORAL_TABLET | Freq: Every evening | ORAL | Status: DC | PRN

## 2014-08-23 MED ORDER — LABETALOL HCL 5 MG/ML IV SOLN: 20.0000 mg | INTRAVENOUS | Status: DC | PRN

## 2014-08-23 MED ORDER — METFORMIN HCL 500 MG PO TABS: 500.0000 mg | ORAL_TABLET | Freq: Two times a day (BID) | ORAL | Status: DC

## 2014-08-23 MED ORDER — HYDRALAZINE HCL 20 MG/ML IJ SOLN: 10.0000 mg | Freq: Once | INTRAMUSCULAR | Status: DC | PRN

## 2014-08-23 MED ORDER — PRENATAL MULTIVITAMIN CH: 1.0000 | ORAL_TABLET | Freq: Every day | ORAL | Status: DC

## 2014-08-23 MED ORDER — ACETAMINOPHEN 325 MG PO TABS: 650.0000 mg | ORAL_TABLET | ORAL | Status: DC | PRN

## 2014-08-23 MED ORDER — CYCLOBENZAPRINE HCL 5 MG PO TABS: 5.0000 mg | ORAL_TABLET | Freq: Three times a day (TID) | ORAL | Status: DC | PRN

## 2014-08-23 MED ORDER — CALCIUM CARBONATE ANTACID 500 MG PO CHEW: 2.0000 | CHEWABLE_TABLET | ORAL | Status: DC | PRN

## 2014-08-23 MED ORDER — GLYBURIDE 5 MG PO TABS: 5.0000 mg | ORAL_TABLET | Freq: Two times a day (BID) | ORAL | Status: DC

## 2014-08-23 NOTE — ED Notes (Signed)
Pt did not bring her blood sugars to today's appointment.  She states her sugars have been good.

## 2014-08-23 NOTE — MAU Note (Signed)
Pt signed out AMA.  She states she cannot stay to be admitted because she will be fired.  States she already has an appointment in the [facility].

## 2014-08-23 NOTE — MAU Note (Signed)
Pt sent from MFM for elevated BP.  Pt gets PNC in [place].  Pt denies HA, visual changes.  Has L mid abd pain.

## 2014-08-23 NOTE — ED Notes (Signed)
Report called to [facility], RN.  Pt to MAU for evaluation.

## 2014-08-23 NOTE — Discharge Instructions (Signed)

## 2014-08-23 NOTE — MAU Provider Note (Signed)
History     CSN: [id number]  Arrival date and time: 08/23/14 1022   First Provider Initiated Contact with Patient 08/23/14 1113      Chief Complaint  Patient presents with  . Hypertension   HPI  Patient is 34 y.o. G2P1001 [redacted]w[redacted]d here referred from MFM 2/2 elevated blood pressures.  Gets care in [place], was seen in [place] earlier today for ultrasound and for suspected uncontrolled DM, has polyhydramnios on sono  +FM, denies LOF, VB, contractions, vaginal discharge.    Past Medical History  Diagnosis Date  . Pregnant   . Gestational diabetes   . Pregnancy induced hypertension   . Obesity     Past Surgical History  Procedure Laterality Date  . Cesarean section      History reviewed. No pertinent family history.  History  Substance Use Topics  . Smoking status: Former Research scientist (life sciences)  . Smokeless tobacco: Not on file  . Alcohol Use: No    Allergies: No Known Allergies  Prescriptions prior to admission  Medication Sig Dispense Refill Last Dose  . cyclobenzaprine (FLEXERIL) 5 MG tablet Take 5 mg by mouth 3 (three) times daily as needed for muscle spasms.   08/22/2014 at Unknown time  . glyBURIDE (DIABETA) 5 MG tablet Take 5 mg by mouth 2 (two) times daily with a meal.   08/23/2014 at Unknown time  . metFORMIN (GLUCOPHAGE) 500 MG tablet Take 500 mg by mouth 2 (two) times daily with a meal.   08/23/2014 at Unknown time  . acetaminophen (TYLENOL) 325 MG tablet Take 650 mg by mouth every 6 (six) hours as needed for mild pain or moderate pain.    08/21/2014    Review of Systems  Constitutional: Negative for fever and chills.  HENT: Negative for congestion.   Respiratory: Negative for cough and shortness of breath.   Cardiovascular: Positive for leg swelling. Negative for chest pain.  Gastrointestinal: Positive for abdominal pain. Negative for heartburn, nausea, vomiting and diarrhea.  Genitourinary: Negative for dysuria, urgency, frequency and hematuria.  Skin: Negative for  itching and rash.  Neurological: Negative for dizziness, loss of consciousness and headaches.   Physical Exam   Blood pressure 148/67, pulse 73, temperature 97.7 F (36.5 C), temperature source Oral, resp. rate 18, last menstrual period 01/04/2014.  08/23/14 1232  --  69  --  --   114/51 mmHg  --  --  --  -- JL    08/23/14 1217  --  75  --  --  143/72 mmHg  --  --  --  -- JL   08/23/14 1202  --  73  --  --  148/67 mmHg  --  --  --  -- JL   08/23/14 1146  --  76  --  --  144/90 mmHg  --  --  --  -- JL   08/23/14 1132  --  77  --  --  144/87 mmHg  --  --  --  -- JL   08/23/14 1117  --  80  --  --  138/82 mmHg  --  --  --  -- JL   08/23/14 1115  --  82  --  --  144/85 mmHg  --  --  --  -- JL   08/23/14 1105  --  81  --  --  167/100 mmHg  --  --  --  -- JL   08/23/14 1052  97.7 F (36.5 C)  77  --  18  174/100 mmHg           Bolded BP with incorrect cuff size  Physical Exam  Constitutional: She is oriented to person, place, and time. She appears well-developed and well-nourished.  HENT:  Head: Normocephalic and atraumatic.  Eyes: Conjunctivae and EOM are normal.  Neck: Normal range of motion.  Cardiovascular: Normal rate.   Respiratory: Effort normal. No respiratory distress.  GI: Soft. Bowel sounds are normal. She exhibits no distension. There is no tenderness.  Musculoskeletal: Normal range of motion. She exhibits edema (2+).  Neurological: She is alert and oriented to person, place, and time.  Skin: Skin is warm and dry. No erythema.    MAU Course  Procedures  MDM  Results for SIBONEY, REQUEJO (MRN [id number]) as of 08/29/2014 00:10  08/23/2014 10:45  Sodium 136  Potassium 3.8  Chloride 112 (H)  CO2 19 (L)  BUN 15  Creatinine 0.89  Calcium 8.9  EGFR (Non-African Amer.) >60  EGFR (African American) >60  Glucose 62 (L)  Anion gap 5  Alkaline Phosphatase 114  Albumin 2.6 (L)  AST 26  ALT 24  Total Protein 6.9  Total Bilirubin 0.2 (L)  WBC 11.2 (H)  RBC 4.60    Hemoglobin 14.2  HCT 39.5  MCV 85.9  MCH 30.9  MCHC 35.9  RDW 12.8  Platelets 337   Results for Lombardo, Gabriell L (MRN [id number]) as of 08/29/2014 00:10  08/23/2014 10:38  Protein Creatinine Ratio 0.29 (H)   Assessment and Plan  Patient is 34 y.o. G2P1001 [redacted]w[redacted]d with hx of cHTN, BDM, polyhydramnios, previous c/s referred from MFM for evaluation of elevated BP likely secondary to cHTN with ?superimposed preEclampsia with mild features - fetal kick counts reinforced - advised patient to be admitted for 24h observation, 24h urine collection however patient declined, left AMA   Layman Gully ROCIO 08/23/2014, 12:28 PM

## 2014-08-30 ENCOUNTER — Encounter: Payer: Self-pay | Admitting: *Deleted

## 2014-08-30 ENCOUNTER — Other Ambulatory Visit (HOSPITAL_COMMUNITY): Payer: Self-pay | Admitting: Maternal and Fetal Medicine

## 2014-08-30 DIAGNOSIS — O10013 Pre-existing essential hypertension complicating pregnancy, third trimester: Secondary | ICD-10-CM

## 2014-08-30 DIAGNOSIS — O34219 Maternal care for unspecified type scar from previous cesarean delivery: Secondary | ICD-10-CM

## 2014-08-30 DIAGNOSIS — O09523 Supervision of elderly multigravida, third trimester: Secondary | ICD-10-CM

## 2014-08-30 DIAGNOSIS — O24119 Pre-existing diabetes mellitus, type 2, in pregnancy, unspecified trimester: Secondary | ICD-10-CM

## 2014-08-30 DIAGNOSIS — O99213 Obesity complicating pregnancy, third trimester: Secondary | ICD-10-CM

## 2014-08-30 DIAGNOSIS — O403XX1 Polyhydramnios, third trimester, fetus 1: Secondary | ICD-10-CM

## 2014-08-31 ENCOUNTER — Other Ambulatory Visit: Payer: Self-pay | Admitting: Obstetrics & Gynecology

## 2014-08-31 ENCOUNTER — Encounter (HOSPITAL_COMMUNITY): Payer: Self-pay

## 2014-08-31 ENCOUNTER — Ambulatory Visit (HOSPITAL_COMMUNITY)
Admission: RE | Admit: 2014-08-31 | Discharge: 2014-08-31 | Disposition: A | Discharge status: Home / Self Care | Payer: BLUE CROSS/BLUE SHIELD | Source: Ambulatory Visit | Attending: Obstetrics & Gynecology | Admitting: Obstetrics & Gynecology

## 2014-08-31 DIAGNOSIS — O34219 Maternal care for unspecified type scar from previous cesarean delivery: Secondary | ICD-10-CM | POA: Insufficient documentation

## 2014-08-31 DIAGNOSIS — E119 Type 2 diabetes mellitus without complications: Secondary | ICD-10-CM | POA: Insufficient documentation

## 2014-08-31 DIAGNOSIS — O403XX0 Polyhydramnios, third trimester, not applicable or unspecified: Secondary | ICD-10-CM | POA: Diagnosis present

## 2014-08-31 DIAGNOSIS — O409XX0 Polyhydramnios, unspecified trimester, not applicable or unspecified: Secondary | ICD-10-CM | POA: Insufficient documentation

## 2014-08-31 DIAGNOSIS — O1490 Unspecified pre-eclampsia, unspecified trimester: Secondary | ICD-10-CM | POA: Insufficient documentation

## 2014-08-31 DIAGNOSIS — O09523 Supervision of elderly multigravida, third trimester: Secondary | ICD-10-CM

## 2014-08-31 DIAGNOSIS — Z3A34 34 weeks gestation of pregnancy: Secondary | ICD-10-CM | POA: Diagnosis not present

## 2014-08-31 DIAGNOSIS — O10913 Unspecified pre-existing hypertension complicating pregnancy, third trimester: Secondary | ICD-10-CM | POA: Diagnosis not present

## 2014-08-31 DIAGNOSIS — O24113 Pre-existing diabetes mellitus, type 2, in pregnancy, third trimester: Secondary | ICD-10-CM | POA: Insufficient documentation

## 2014-08-31 DIAGNOSIS — O10919 Unspecified pre-existing hypertension complicating pregnancy, unspecified trimester: Secondary | ICD-10-CM

## 2014-08-31 DIAGNOSIS — O403XX1 Polyhydramnios, third trimester, fetus 1: Secondary | ICD-10-CM

## 2014-08-31 DIAGNOSIS — O10013 Pre-existing essential hypertension complicating pregnancy, third trimester: Secondary | ICD-10-CM

## 2014-08-31 DIAGNOSIS — O99213 Obesity complicating pregnancy, third trimester: Secondary | ICD-10-CM | POA: Insufficient documentation

## 2014-08-31 DIAGNOSIS — O24119 Pre-existing diabetes mellitus, type 2, in pregnancy, unspecified trimester: Secondary | ICD-10-CM

## 2014-08-31 MED ORDER — LABETALOL HCL 100 MG PO TABS: 100.0000 mg | ORAL_TABLET | Freq: Two times a day (BID) | ORAL | Status: DC

## 2014-08-31 NOTE — Progress Notes (Signed)
Spoke to Dr. Sherrie George re this pt who is scheduled to see [facility] as a transfer on 7/11.  Pt in MFM at present with elevated BPs. 160/111. No other s/sx of Preeclampsia.  Will begin Labetolol  bid. Pt has received care in [place].  She is Class B GDM.

## 2014-09-01 ENCOUNTER — Encounter: Payer: Self-pay | Admitting: *Deleted

## 2014-09-01 NOTE — Progress Notes (Signed)
FMLA completed, needs to sign ROI and then we can give the patient her FMLA paperwork.

## 2014-09-05 ENCOUNTER — Encounter: Payer: Self-pay | Admitting: Family Medicine

## 2014-09-05 ENCOUNTER — Ambulatory Visit (INDEPENDENT_AMBULATORY_CARE_PROVIDER_SITE_OTHER): Payer: Self-pay | Admitting: Family Medicine

## 2014-09-05 ENCOUNTER — Encounter: Payer: Self-pay | Admitting: *Deleted

## 2014-09-05 VITALS — BP 154/84 | HR 80 | Temp 98.7°F | Wt 329.1 lb

## 2014-09-05 DIAGNOSIS — O24119 Pre-existing diabetes mellitus, type 2, in pregnancy, unspecified trimester: Secondary | ICD-10-CM

## 2014-09-05 DIAGNOSIS — O139 Gestational [pregnancy-induced] hypertension without significant proteinuria, unspecified trimester: Secondary | ICD-10-CM

## 2014-09-05 DIAGNOSIS — O133 Gestational [pregnancy-induced] hypertension without significant proteinuria, third trimester: Secondary | ICD-10-CM

## 2014-09-05 DIAGNOSIS — O34219 Maternal care for unspecified type scar from previous cesarean delivery: Secondary | ICD-10-CM

## 2014-09-05 DIAGNOSIS — O3421 Maternal care for scar from previous cesarean delivery: Secondary | ICD-10-CM

## 2014-09-05 DIAGNOSIS — O0993 Supervision of high risk pregnancy, unspecified, third trimester: Secondary | ICD-10-CM | POA: Diagnosis not present

## 2014-09-05 DIAGNOSIS — O0933 Supervision of pregnancy with insufficient antenatal care, third trimester: Secondary | ICD-10-CM

## 2014-09-05 DIAGNOSIS — O24113 Pre-existing diabetes mellitus, type 2, in pregnancy, third trimester: Secondary | ICD-10-CM

## 2014-09-05 DIAGNOSIS — O0990 Supervision of high risk pregnancy, unspecified, unspecified trimester: Secondary | ICD-10-CM | POA: Insufficient documentation

## 2014-09-05 DIAGNOSIS — O403XX1 Polyhydramnios, third trimester, fetus 1: Secondary | ICD-10-CM

## 2014-09-05 DIAGNOSIS — F411 Generalized anxiety disorder: Secondary | ICD-10-CM

## 2014-09-05 LAB — POCT URINALYSIS DIP (DEVICE)
Bilirubin Urine: NEGATIVE
Glucose, UA: NEGATIVE mg/dL
KETONES UR: NEGATIVE mg/dL
Nitrite: NEGATIVE
PROTEIN: 100 mg/dL — AB
SPECIFIC GRAVITY, URINE: 1.02 (ref 1.005–1.030)
Urobilinogen, UA: 0.2 mg/dL (ref 0.0–1.0)
pH: 5.5 (ref 5.0–8.0)

## 2014-09-05 LAB — COMPREHENSIVE METABOLIC PANEL
ALK PHOS: 91 U/L (ref 39–117)
ALT: 17 U/L (ref 0–35)
AST: 17 U/L (ref 0–37)
Albumin: 2.7 g/dL — ABNORMAL LOW (ref 3.5–5.2)
BUN: 9 mg/dL (ref 6–23)
CHLORIDE: 105 meq/L (ref 96–112)
CO2: 21 meq/L (ref 19–32)
Calcium: 9.2 mg/dL (ref 8.4–10.5)
Creat: 0.62 mg/dL (ref 0.50–1.10)
Glucose, Bld: 62 mg/dL — ABNORMAL LOW (ref 70–99)
Potassium: 4 mEq/L (ref 3.5–5.3)
Sodium: 141 mEq/L (ref 135–145)
Total Bilirubin: 0.3 mg/dL (ref 0.2–1.2)
Total Protein: 5.4 g/dL — ABNORMAL LOW (ref 6.0–8.3)

## 2014-09-05 LAB — OB RESULTS CONSOLE GBS: GBS: POSITIVE

## 2014-09-05 NOTE — Progress Notes (Signed)
Subjective:  Morgan Allen is a 34 y.o. G2P1002 at 107w3d being seen today for initial prenatal visit.  Prenatal care stared in [place] then to [facility], but her provider is stopping delivering babies.  She is followed by MFM for DM and HTN. DM diagnosed in early pregnancy at 14 wks. HTN was found then as well, but only recently began meds for this.  Patient reports no complaints.  Contractions: Not present.  Vag. Bleeding: None. Movement: Absent. Denies leaking of fluid.   The following portions of the patient's history were reviewed and updated as appropriate: allergies, current medications, past family history, past medical history, past social history, past surgical history and problem list.   Objective:   Filed Vitals:   09/05/14 0941 09/05/14 0946  BP: 142/100 154/84  Pulse: 90 80  Temp: 98.7 F (37.1 C)   Weight: 329 lb 1.6 oz (149.279 kg)     Fetal Status: Fetal Heart Rate (bpm): 140   Movement: Absent     General:  Alert, oriented and cooperative. Patient is in no acute distress.  Skin: Skin is warm and dry. No rash noted.   Cardiovascular: Normal heart rate noted  Respiratory: Normal respiratory effort, no problems with respiration noted  Abdomen: Soft, gravid, appropriate for gestational age. Pain/Pressure: Present     Vaginal: Vag. Bleeding: None.       Cervix: Exam revealed        Extremities: Normal range of motion.  Edema: None  Mental Status: Normal mood and affect. Normal behavior. Normal judgment and thought content.   Urinalysis: Urine Protein: 2+ Urine Glucose: Negative Brings no book, reports pp BS in the 130 range and fastings 50-60. Assessment and Plan:  Pregnancy: G2P1002 at [redacted]w[redacted]d  1. Late prenatal care, third trimester Continue routine prenatal care.  - Prenatal Profile - Culture, OB Urine - Prescript Monitor Profile(19)  2. Supervision of high risk pregnancy, antepartum, third trimester  - HIV antibody  3. Gestational hypertension,  antepartum Given 2+ protein today--will check labs--delivery will be moved to 37 wks with new onset proteinuria. - CBC - Comprehensive metabolic panel - Protein / creatinine ratio, urine  4. Pre-existing type 2 diabetes affecting pregnancy, antepartum Continue Glyburide Weekly BPP's with MFM  5. Previous cesarean delivery, antepartum condition or complication Booked for RCS at 39 wks.  6. Polyhydramnios, third trimester, fetus 1    Term labor symptoms and general obstetric precautions including but not limited to vaginal bleeding, contractions, leaking of fluid and fetal movement were reviewed in detail with the patient.  Please refer to After Visit Summary for other counseling recommendations.   Return in 1 week (on 09/12/2014) for [facility], OB visit and NST, NST only in 4 days.   Reva Bores, MD

## 2014-09-05 NOTE — Progress Notes (Signed)
FMLA/Short term disability faxed

## 2014-09-05 NOTE — Progress Notes (Signed)
All ultrasounds done in MFM for this pregnancy Initial OB appointment Pt states she does not feel the baby move due to excess fluid Labs Pt states she needs FMLA paperwork back dated to be out of work since 6/28 per Dr. Harlon Flor

## 2014-09-05 NOTE — Patient Instructions (Signed)
Gestational Diabetes Mellitus Gestational diabetes mellitus, often simply referred to as gestational diabetes, is a type of diabetes that some women develop during pregnancy. In gestational diabetes, the pancreas does not make enough insulin (a hormone), the cells are less responsive to the insulin that is made (insulin resistance), or both.Normally, insulin moves sugars from food into the tissue cells. The tissue cells use the sugars for energy. The lack of insulin or the lack of normal response to insulin causes excess sugars to build up in the blood instead of going into the tissue cells. As a result, high blood sugar (hyperglycemia) develops. The effect of high sugar (glucose) levels can cause many problems.  RISK FACTORS You have an increased chance of developing gestational diabetes if you have a family history of diabetes and also have one or more of the following risk factors:  A body mass index over 30 (obesity).  A previous pregnancy with gestational diabetes.  An older age at the time of pregnancy. If blood glucose levels are kept in the normal range during pregnancy, women can have a healthy pregnancy. If your blood glucose levels are not well controlled, there may be risks to you, your unborn baby (fetus), your labor and delivery, or your newborn baby.  SYMPTOMS  If symptoms are experienced, they are much like symptoms you would normally expect during pregnancy. The symptoms of gestational diabetes include:   Increased thirst (polydipsia).  Increased urination (polyuria).  Increased urination during the night (nocturia).  Weight loss. This weight loss may be rapid.  Frequent, recurring infections.  Tiredness (fatigue).  Weakness.  Vision changes, such as blurred vision.  Fruity smell to your breath.  Abdominal pain. DIAGNOSIS Diabetes is diagnosed when blood glucose levels are increased. Your blood glucose level may be checked by one or more of the following blood  tests:  A fasting blood glucose test. You will not be allowed to eat for at least 8 hours before a blood sample is taken.  A random blood glucose test. Your blood glucose is checked at any time of the day regardless of when you ate.  A hemoglobin A1c blood glucose test. A hemoglobin A1c test provides information about blood glucose control over the previous 3 months.  An oral glucose tolerance test (OGTT). Your blood glucose is measured after you have not eaten (fasted) for 1-3 hours and then after you drink a glucose-containing beverage. Since the hormones that cause insulin resistance are highest at about 24-28 weeks of a pregnancy, an OGTT is usually performed during that time. If you have risk factors for gestational diabetes, your health care provider may test you for gestational diabetes earlier than 24 weeks of pregnancy. TREATMENT   You will need to take diabetes medicine or insulin daily to keep blood glucose levels in the desired range.  You will need to match insulin dosing with exercise and healthy food choices. The treatment goal is to maintain the before-meal (preprandial), bedtime, and overnight blood glucose level at 60-99 mg/dL during pregnancy. The treatment goal is to further maintain peak after-meal blood sugar (postprandial glucose) level at 100-140 mg/dL. HOME CARE INSTRUCTIONS   Have your hemoglobin A1c level checked twice a year.  Perform daily blood glucose monitoring as directed by your health care provider. It is common to perform frequent blood glucose monitoring.  Monitor urine ketones when you are ill and as directed by your health care provider.  Take your diabetes medicine and insulin as directed by your health care provider   to maintain your blood glucose level in the desired range.  Never run out of diabetes medicine or insulin. It is needed every day.  Adjust insulin based on your intake of carbohydrates. Carbohydrates can raise blood glucose levels but  need to be included in your diet. Carbohydrates provide vitamins, minerals, and fiber which are an essential part of a healthy diet. Carbohydrates are found in fruits, vegetables, whole grains, dairy products, legumes, and foods containing added sugars.  Eat healthy foods. Alternate 3 meals with 3 snacks.  Maintain a healthy weight gain. The usual total expected weight gain varies according to your prepregnancy body mass index (BMI).  Carry a medical alert card or wear your medical alert jewelry.  Carry a 15-gram carbohydrate snack with you at all times to treat low blood glucose (hypoglycemia). Some examples of 15-gram carbohydrate snacks include:  Glucose tablets, 3 or 4.  Glucose gel, 15-gram tube.  Raisins, 2 tablespoons (24 g).  Jelly beans, 6.  Animal crackers, 8.  Fruit juice, regular soda, or low-fat milk, 4 ounces (120 mL).  Gummy treats, 9.  Recognize hypoglycemia. Hypoglycemia during pregnancy occurs with blood glucose levels of 60 mg/dL and below. The risk for hypoglycemia increases when fasting or skipping meals, during or after intense exercise, and during sleep. Hypoglycemia symptoms can include:  Tremors or shakes.  Decreased ability to concentrate.  Sweating.  Increased heart rate.  Headache.  Dry mouth.  Hunger.  Irritability.  Anxiety.  Restless sleep.  Altered speech or coordination.  Confusion.  Treat hypoglycemia promptly. If you are alert and able to safely swallow, follow the 15:15 rule:  Take 15-20 grams of rapid-acting glucose or carbohydrate. Rapid-acting options include glucose gel, glucose tablets, or 4 ounces (120 mL) of fruit juice, regular soda, or low-fat milk.  Check your blood glucose level 15 minutes after taking the glucose.  Take 15-20 grams more of glucose if the repeat blood glucose level is still 70 mg/dL or below.  Eat a meal or snack within 1 hour once blood glucose levels return to normal.  Be alert to polyuria  (excess urination) and polydipsia (excess thirst) which are early signs of hyperglycemia. An early awareness of hyperglycemia allows for prompt treatment. Treat hyperglycemia as directed by your health care provider.  Engage in at least 30 minutes of physical activity a day or as directed by your health care provider. Ten minutes of physical activity timed 30 minutes after each meal is encouraged to control postprandial blood glucose levels.  Adjust your insulin dosing and food intake as needed if you start a new exercise or sport.  Follow your sick-day plan at any time you are unable to eat or drink as usual.  Avoid tobacco and alcohol use.  Keep all follow-up visits as directed by your health care provider.  Follow the advice of your health care provider regarding your prenatal and post-delivery (postpartum) appointments, meal planning, exercise, medicines, vitamins, blood tests, other medical tests, and physical activities.  Perform daily skin and foot care. Examine your skin and feet daily for cuts, bruises, redness, nail problems, bleeding, blisters, or sores.  Brush your teeth and gums at least twice a day and floss at least once a day. Follow up with your dentist regularly.  Schedule an eye exam during the first trimester of your pregnancy or as directed by your health care provider.  Share your diabetes management plan with your workplace or school.  Stay up-to-date with immunizations.  Learn to manage stress.    Obtain ongoing diabetes education and support as needed.  Learn about and consider breastfeeding your baby.  You should have your blood sugar level checked 6-12 weeks after delivery. This is done with an oral glucose tolerance test (OGTT). SEEK MEDICAL CARE IF:   You are unable to eat food or drink fluids for more than 6 hours.  You have nausea and vomiting for more than 6 hours.  You have a blood glucose level of 200 mg/dL and you have ketones in your  urine.  There is a change in mental status.  You develop vision problems.  You have a persistent headache.  You have upper abdominal pain or discomfort.  You develop an additional serious illness.  You have diarrhea for more than 6 hours.  You have been sick or have had a fever for a couple of days and are not getting better. SEEK IMMEDIATE MEDICAL CARE IF:   You have difficulty breathing.  You no longer feel the baby moving.  You are bleeding or have discharge from your vagina.  You start having premature contractions or labor. MAKE SURE YOU:  Understand these instructions.  Will watch your condition.  Will get help right away if you are not doing well or get worse. Document Released: 05/20/2000 Document Revised: 06/28/2013 Document Reviewed: 09/10/2011 [employer] Patient Information 2015 [employer]. This information is not intended to replace advice given to you by your health care provider. Make sure you discuss any questions you have with your health care provider.  Third Trimester of Pregnancy The third trimester is from week 29 through week 42, months 7 through 9. The third trimester is a time when the fetus is growing rapidly. At the end of the ninth month, the fetus is about 20 inches in length and weighs 6-10 pounds.  BODY CHANGES Your body goes through many changes during pregnancy. The changes vary from woman to woman.   Your weight will continue to increase. You can expect to gain 25-35 pounds (11-16 kg) by the end of the pregnancy.  You may begin to get stretch marks on your hips, abdomen, and breasts.  You may urinate more often because the fetus is moving lower into your pelvis and pressing on your bladder.  You may develop or continue to have heartburn as a result of your pregnancy.  You may develop constipation because certain hormones are causing the muscles that push waste through your intestines to slow down.  You may develop hemorrhoids or  swollen, bulging veins (varicose veins).  You may have pelvic pain because of the weight gain and pregnancy hormones relaxing your joints between the bones in your pelvis. Backaches may result from overexertion of the muscles supporting your posture.  You may have changes in your hair. These can include thickening of your hair, rapid growth, and changes in texture. Some women also have hair loss during or after pregnancy, or hair that feels dry or thin. Your hair will most likely return to normal after your baby is born.  Your breasts will continue to grow and be tender. A yellow discharge may leak from your breasts called colostrum.  Your belly button may stick out.  You may feel short of breath because of your expanding uterus.  You may notice the fetus "dropping," or moving lower in your abdomen.  You may have a bloody mucus discharge. This usually occurs a few days to a week before labor begins.  Your cervix becomes thin and soft (effaced) near your due   date. WHAT TO EXPECT AT YOUR PRENATAL EXAMS  You will have prenatal exams every 2 weeks until week 36. Then, you will have weekly prenatal exams. During a routine prenatal visit:  You will be weighed to make sure you and the fetus are growing normally.  Your blood pressure is taken.  Your abdomen will be measured to track your baby's growth.  The fetal heartbeat will be listened to.  Any test results from the previous visit will be discussed.  You may have a cervical check near your due date to see if you have effaced. At around 36 weeks, your caregiver will check your cervix. At the same time, your caregiver will also perform a test on the secretions of the vaginal tissue. This test is to determine if a type of bacteria, Group B streptococcus, is present. Your caregiver will explain this further. Your caregiver may ask you:  What your birth plan is.  How you are feeling.  If you are feeling the baby move.  If you have had  any abnormal symptoms, such as leaking fluid, bleeding, severe headaches, or abdominal cramping.  If you have any questions. Other tests or screenings that may be performed during your third trimester include:  Blood tests that check for low iron levels (anemia).  Fetal testing to check the health, activity level, and growth of the fetus. Testing is done if you have certain medical conditions or if there are problems during the pregnancy. FALSE LABOR You may feel small, irregular contractions that eventually go away. These are called Braxton Hicks contractions, or false labor. Contractions may last for hours, days, or even weeks before true labor sets in. If contractions come at regular intervals, intensify, or become painful, it is best to be seen by your caregiver.  SIGNS OF LABOR   Menstrual-like cramps.  Contractions that are 5 minutes apart or less.  Contractions that start on the top of the uterus and spread down to the lower abdomen and back.  A sense of increased pelvic pressure or back pain.  A watery or bloody mucus discharge that comes from the vagina. If you have any of these signs before the 37th week of pregnancy, call your caregiver right away. You need to go to the hospital to get checked immediately. HOME CARE INSTRUCTIONS   Avoid all smoking, herbs, alcohol, and unprescribed drugs. These chemicals affect the formation and growth of the baby.  Follow your caregiver's instructions regarding medicine use. There are medicines that are either safe or unsafe to take during pregnancy.  Exercise only as directed by your caregiver. Experiencing uterine cramps is a good sign to stop exercising.  Continue to eat regular, healthy meals.  Wear a good support bra for breast tenderness.  Do not use hot tubs, steam rooms, or saunas.  Wear your seat belt at all times when driving.  Avoid raw meat, uncooked cheese, cat litter boxes, and soil used by cats. These carry germs that  can cause birth defects in the baby.  Take your prenatal vitamins.  Try taking a stool softener (if your caregiver approves) if you develop constipation. Eat more high-fiber foods, such as fresh vegetables or fruit and whole grains. Drink plenty of fluids to keep your urine clear or pale yellow.  Take warm sitz baths to soothe any pain or discomfort caused by hemorrhoids. Use hemorrhoid cream if your caregiver approves.  If you develop varicose veins, wear support hose. Elevate your feet for 15 minutes, 3-4 times a   day. Limit salt in your diet.  Avoid heavy lifting, wear low heal shoes, and practice good posture.  Rest a lot with your legs elevated if you have leg cramps or low back pain.  Visit your dentist if you have not gone during your pregnancy. Use a soft toothbrush to brush your teeth and be gentle when you floss.  A sexual relationship may be continued unless your caregiver directs you otherwise.  Do not travel far distances unless it is absolutely necessary and only with the approval of your caregiver.  Take prenatal classes to understand, practice, and ask questions about the labor and delivery.  Make a trial run to the hospital.  Pack your hospital bag.  Prepare the baby's nursery.  Continue to go to all your prenatal visits as directed by your caregiver. SEEK MEDICAL CARE IF:  You are unsure if you are in labor or if your water has broken.  You have dizziness.  You have mild pelvic cramps, pelvic pressure, or nagging pain in your abdominal area.  You have persistent nausea, vomiting, or diarrhea.  You have a bad smelling vaginal discharge.  You have pain with urination. SEEK IMMEDIATE MEDICAL CARE IF:   You have a fever.  You are leaking fluid from your vagina.  You have spotting or bleeding from your vagina.  You have severe abdominal cramping or pain.  You have rapid weight loss or gain.  You have shortness of breath with chest pain.  You notice  sudden or extreme swelling of your face, hands, ankles, feet, or legs.  You have not felt your baby move in over an hour.  You have severe headaches that do not go away with medicine.  You have vision changes. Document Released: 02/05/2001 Document Revised: 02/16/2013 Document Reviewed: 04/14/2012 [employer] Patient Information 2015 [employer]. This information is not intended to replace advice given to you by your health care provider. Make sure you discuss any questions you have with your health care provider.  Breastfeeding Deciding to breastfeed is one of the best choices you can make for you and your baby. A change in hormones during pregnancy causes your breast tissue to grow and increases the number and size of your milk ducts. These hormones also allow proteins, sugars, and fats from your blood supply to make breast milk in your milk-producing glands. Hormones prevent breast milk from being released before your baby is born as well as prompt milk flow after birth. Once breastfeeding has begun, thoughts of your baby, as well as his or her sucking or crying, can stimulate the release of milk from your milk-producing glands.  BENEFITS OF BREASTFEEDING For Your Baby  Your first milk (colostrum) helps your baby's digestive system function better.   There are antibodies in your milk that help your baby fight off infections.   Your baby has a lower incidence of asthma, allergies, and sudden infant death syndrome.   The nutrients in breast milk are better for your baby than infant formulas and are designed uniquely for your baby's needs.   Breast milk improves your baby's brain development.   Your baby is less likely to develop other conditions, such as childhood obesity, asthma, or type 2 diabetes mellitus.  For You   Breastfeeding helps to create a very special bond between you and your baby.   Breastfeeding is convenient. Breast milk is always available at the correct  temperature and costs nothing.   Breastfeeding helps to burn calories and helps you lose   the weight gained during pregnancy.   Breastfeeding makes your uterus contract to its prepregnancy size faster and slows bleeding (lochia) after you give birth.   Breastfeeding helps to lower your risk of developing type 2 diabetes mellitus, osteoporosis, and breast or ovarian cancer later in life. SIGNS THAT YOUR BABY IS HUNGRY Early Signs of Hunger  Increased alertness or activity.  Stretching.  Movement of the head from side to side.  Movement of the head and opening of the mouth when the corner of the mouth or cheek is stroked (rooting).  Increased sucking sounds, smacking lips, cooing, sighing, or squeaking.  Hand-to-mouth movements.  Increased sucking of fingers or hands. Late Signs of Hunger  Fussing.  Intermittent crying. Extreme Signs of Hunger Signs of extreme hunger will require calming and consoling before your baby will be able to breastfeed successfully. Do not wait for the following signs of extreme hunger to occur before you initiate breastfeeding:   Restlessness.  A loud, strong cry.   Screaming. BREASTFEEDING BASICS Breastfeeding Initiation  Find a comfortable place to sit or lie down, with your neck and back well supported.  Place a pillow or rolled up blanket under your baby to bring him or her to the level of your breast (if you are seated). Nursing pillows are specially designed to help support your arms and your baby while you breastfeed.  Make sure that your baby's abdomen is facing your abdomen.   Gently massage your breast. With your fingertips, massage from your chest wall toward your nipple in a circular motion. This encourages milk flow. You may need to continue this action during the feeding if your milk flows slowly.  Support your breast with 4 fingers underneath and your thumb above your nipple. Make sure your fingers are well away from your  nipple and your baby's mouth.   Stroke your baby's lips gently with your finger or nipple.   When your baby's mouth is open wide enough, quickly bring your baby to your breast, placing your entire nipple and as much of the colored area around your nipple (areola) as possible into your baby's mouth.   More areola should be visible above your baby's upper lip than below the lower lip.   Your baby's tongue should be between his or her lower gum and your breast.   Ensure that your baby's mouth is correctly positioned around your nipple (latched). Your baby's lips should create a seal on your breast and be turned out (everted).  It is common for your baby to suck about 2-3 minutes in order to start the flow of breast milk. Latching Teaching your baby how to latch on to your breast properly is very important. An improper latch can cause nipple pain and decreased milk supply for you and poor weight gain in your baby. Also, if your baby is not latched onto your nipple properly, he or she may swallow some air during feeding. This can make your baby fussy. Burping your baby when you switch breasts during the feeding can help to get rid of the air. However, teaching your baby to latch on properly is still the best way to prevent fussiness from swallowing air while breastfeeding. Signs that your baby has successfully latched on to your nipple:    Silent tugging or silent sucking, without causing you pain.   Swallowing heard between every 3-4 sucks.    Muscle movement above and in front of his or her ears while sucking.  Signs that your   baby has not successfully latched on to nipple:   Sucking sounds or smacking sounds from your baby while breastfeeding.  Nipple pain. If you think your baby has not latched on correctly, slip your finger into the corner of your baby's mouth to break the suction and place it between your baby's gums. Attempt breastfeeding initiation again. Signs of Successful  Breastfeeding Signs from your baby:   A gradual decrease in the number of sucks or complete cessation of sucking.   Falling asleep.   Relaxation of his or her body.   Retention of a small amount of milk in his or her mouth.   Letting go of your breast by himself or herself. Signs from you:  Breasts that have increased in firmness, weight, and size 1-3 hours after feeding.   Breasts that are softer immediately after breastfeeding.  Increased milk volume, as well as a change in milk consistency and color by the fifth day of breastfeeding.   Nipples that are not sore, cracked, or bleeding. Signs That Your Baby is Getting Enough Milk  Wetting at least 3 diapers in a 24-hour period. The urine should be clear and pale yellow by age 5 days.  At least 3 stools in a 24-hour period by age 5 days. The stool should be soft and yellow.  At least 3 stools in a 24-hour period by age 7 days. The stool should be seedy and yellow.  No loss of weight greater than 10% of birth weight during the first 3 days of age.  Average weight gain of 4-7 ounces (113-198 g) per week after age 4 days.  Consistent daily weight gain by age 5 days, without weight loss after the age of 2 weeks. After a feeding, your baby may spit up a small amount. This is common. BREASTFEEDING FREQUENCY AND DURATION Frequent feeding will help you make more milk and can prevent sore nipples and breast engorgement. Breastfeed when you feel the need to reduce the fullness of your breasts or when your baby shows signs of hunger. This is called "breastfeeding on demand." Avoid introducing a pacifier to your baby while you are working to establish breastfeeding (the first 4-6 weeks after your baby is born). After this time you may choose to use a pacifier. Research has shown that pacifier use during the first year of a baby's life decreases the risk of sudden infant death syndrome (SIDS). Allow your baby to feed on each breast as  long as he or she wants. Breastfeed until your baby is finished feeding. When your baby unlatches or falls asleep while feeding from the first breast, offer the second breast. Because newborns are often sleepy in the first few weeks of life, you may need to awaken your baby to get him or her to feed. Breastfeeding times will vary from baby to baby. However, the following rules can serve as a guide to help you ensure that your baby is properly fed:  Newborns (babies 4 weeks of age or younger) may breastfeed every 1-3 hours.  Newborns should not go longer than 3 hours during the day or 5 hours during the night without breastfeeding.  You should breastfeed your baby a minimum of 8 times in a 24-hour period until you begin to introduce solid foods to your baby at around 6 months of age. BREAST MILK PUMPING Pumping and storing breast milk allows you to ensure that your baby is exclusively fed your breast milk, even at times when you are unable to   breastfeed. This is especially important if you are going back to work while you are still breastfeeding or when you are not able to be present during feedings. Your lactation consultant can give you guidelines on how long it is safe to store breast milk.  A breast pump is a machine that allows you to pump milk from your breast into a sterile bottle. The pumped breast milk can then be stored in a refrigerator or freezer. Some breast pumps are operated by hand, while others use electricity. Ask your lactation consultant which type will work best for you. Breast pumps can be purchased, but some hospitals and breastfeeding support groups lease breast pumps on a monthly basis. A lactation consultant can teach you how to hand express breast milk, if you prefer not to use a pump.  CARING FOR YOUR BREASTS WHILE YOU BREASTFEED Nipples can become dry, cracked, and sore while breastfeeding. The following recommendations can help keep your breasts moisturized and  healthy:  Avoid using soap on your nipples.   Wear a supportive bra. Although not required, special nursing bras and tank tops are designed to allow access to your breasts for breastfeeding without taking off your entire bra or top. Avoid wearing underwire-style bras or extremely tight bras.  Air dry your nipples for 3-4minutes after each feeding.   Use only cotton bra pads to absorb leaked breast milk. Leaking of breast milk between feedings is normal.   Use lanolin on your nipples after breastfeeding. Lanolin helps to maintain your skin's normal moisture barrier. If you use pure lanolin, you do not need to wash it off before feeding your baby again. Pure lanolin is not toxic to your baby. You may also hand express a few drops of breast milk and gently massage that milk into your nipples and allow the milk to air dry. In the first few weeks after giving birth, some women experience extremely full breasts (engorgement). Engorgement can make your breasts feel heavy, warm, and tender to the touch. Engorgement peaks within 3-5 days after you give birth. The following recommendations can help ease engorgement:  Completely empty your breasts while breastfeeding or pumping. You may want to start by applying warm, moist heat (in the shower or with warm water-soaked hand towels) just before feeding or pumping. This increases circulation and helps the milk flow. If your baby does not completely empty your breasts while breastfeeding, pump any extra milk after he or she is finished.  Wear a snug bra (nursing or regular) or tank top for 1-2 days to signal your body to slightly decrease milk production.  Apply ice packs to your breasts, unless this is too uncomfortable for you.  Make sure that your baby is latched on and positioned properly while breastfeeding. If engorgement persists after 48 hours of following these recommendations, contact your health care provider or a lactation consultant. OVERALL  HEALTH CARE RECOMMENDATIONS WHILE BREASTFEEDING  Eat healthy foods. Alternate between meals and snacks, eating 3 of each per day. Because what you eat affects your breast milk, some of the foods may make your baby more irritable than usual. Avoid eating these foods if you are sure that they are negatively affecting your baby.  Drink milk, fruit juice, and water to satisfy your thirst (about 10 glasses a day).   Rest often, relax, and continue to take your prenatal vitamins to prevent fatigue, stress, and anemia.  Continue breast self-awareness checks.  Avoid chewing and smoking tobacco.  Avoid alcohol and drug use.   Some medicines that may be harmful to your baby can pass through breast milk. It is important to ask your health care provider before taking any medicine, including all over-the-counter and prescription medicine as well as vitamin and herbal supplements. It is possible to become pregnant while breastfeeding. If birth control is desired, ask your health care provider about options that will be safe for your baby. SEEK MEDICAL CARE IF:   You feel like you want to stop breastfeeding or have become frustrated with breastfeeding.  You have painful breasts or nipples.  Your nipples are cracked or bleeding.  Your breasts are red, tender, or warm.  You have a swollen area on either breast.  You have a fever or chills.  You have nausea or vomiting.  You have drainage other than breast milk from your nipples.  Your breasts do not become full before feedings by the fifth day after you give birth.  You feel sad and depressed.  Your baby is too sleepy to eat well.  Your baby is having trouble sleeping.   Your baby is wetting less than 3 diapers in a 24-hour period.  Your baby has less than 3 stools in a 24-hour period.  Your baby's skin or the white part of his or her eyes becomes yellow.   Your baby is not gaining weight by 5 days of age. SEEK IMMEDIATE MEDICAL CARE  IF:   Your baby is overly tired (lethargic) and does not want to wake up and feed.  Your baby develops an unexplained fever. Document Released: 02/11/2005 Document Revised: 02/16/2013 Document Reviewed: 08/05/2012 Exit[employer] Patient Information 2015 [employer]. This information is not intended to replace advice given to you by your health care provider. Make sure you discuss any questions you have with your health care provider.  

## 2014-09-06 ENCOUNTER — Encounter: Payer: Self-pay | Admitting: Family Medicine

## 2014-09-06 ENCOUNTER — Encounter (HOSPITAL_COMMUNITY): Payer: Self-pay | Admitting: *Deleted

## 2014-09-06 DIAGNOSIS — Z283 Underimmunization status: Secondary | ICD-10-CM | POA: Insufficient documentation

## 2014-09-06 DIAGNOSIS — O9989 Other specified diseases and conditions complicating pregnancy, childbirth and the puerperium: Secondary | ICD-10-CM

## 2014-09-06 DIAGNOSIS — O09899 Supervision of other high risk pregnancies, unspecified trimester: Secondary | ICD-10-CM | POA: Insufficient documentation

## 2014-09-06 LAB — PRENATAL PROFILE (SOLSTAS)
Antibody Screen: NEGATIVE
BASOS ABS: 0 10*3/uL (ref 0.0–0.1)
Basophils Relative: 0 % (ref 0–1)
Eosinophils Absolute: 0.1 10*3/uL (ref 0.0–0.7)
Eosinophils Relative: 1 % (ref 0–5)
HCT: 40 % (ref 36.0–46.0)
HEP B S AG: NEGATIVE
HIV: NONREACTIVE
Hemoglobin: 13.5 g/dL (ref 12.0–15.0)
Lymphocytes Relative: 17 % (ref 12–46)
Lymphs Abs: 1.6 10*3/uL (ref 0.7–4.0)
MCH: 30.2 pg (ref 26.0–34.0)
MCHC: 33.8 g/dL (ref 30.0–36.0)
MCV: 89.5 fL (ref 78.0–100.0)
MONO ABS: 0.8 10*3/uL (ref 0.1–1.0)
MPV: 10.4 fL (ref 8.6–12.4)
Monocytes Relative: 8 % (ref 3–12)
NEUTROS ABS: 7 10*3/uL (ref 1.7–7.7)
Neutrophils Relative %: 74 % (ref 43–77)
Platelets: 258 10*3/uL (ref 150–400)
RBC: 4.47 MIL/uL (ref 3.87–5.11)
RDW: 13.8 % (ref 11.5–15.5)
RH TYPE: POSITIVE
RUBELLA: 0.82 {index} (ref <0.90)
WBC: 9.5 10*3/uL (ref 4.0–10.5)

## 2014-09-06 LAB — PROTEIN / CREATININE RATIO, URINE
Creatinine, Urine: 54.7 mg/dL
Protein Creatinine Ratio: 1.72 — ABNORMAL HIGH (ref <0.15)
Total Protein, Urine: 94 mg/dL — ABNORMAL HIGH (ref 5–24)

## 2014-09-07 ENCOUNTER — Other Ambulatory Visit (HOSPITAL_COMMUNITY): Payer: Self-pay

## 2014-09-07 ENCOUNTER — Ambulatory Visit (HOSPITAL_COMMUNITY): Payer: BLUE CROSS/BLUE SHIELD

## 2014-09-07 ENCOUNTER — Encounter: Payer: Self-pay | Admitting: Family Medicine

## 2014-09-07 DIAGNOSIS — B951 Streptococcus, group B, as the cause of diseases classified elsewhere: Secondary | ICD-10-CM | POA: Insufficient documentation

## 2014-09-07 DIAGNOSIS — O2340 Unspecified infection of urinary tract in pregnancy, unspecified trimester: Secondary | ICD-10-CM

## 2014-09-07 LAB — CULTURE, OB URINE: Colony Count: 5000

## 2014-09-08 ENCOUNTER — Ambulatory Visit (HOSPITAL_COMMUNITY): Payer: BLUE CROSS/BLUE SHIELD | Attending: Family Medicine

## 2014-09-08 ENCOUNTER — Ambulatory Visit (HOSPITAL_COMMUNITY)
Admission: RE | Admit: 2014-09-08 | Payer: BLUE CROSS/BLUE SHIELD | Source: Ambulatory Visit | Attending: Maternal and Fetal Medicine | Admitting: Maternal and Fetal Medicine

## 2014-09-11 LAB — FENTANYL (GC/LC/MS), URINE
Fentanyl, confirm: NEGATIVE ng/mL (ref <0.5)
Norfentanyl, confirm: NEGATIVE ng/mL (ref <0.5)

## 2014-09-12 ENCOUNTER — Ambulatory Visit: Payer: BLUE CROSS/BLUE SHIELD | Admitting: Obstetrics & Gynecology

## 2014-09-12 ENCOUNTER — Telehealth: Payer: Self-pay | Admitting: Obstetrics & Gynecology

## 2014-09-12 VITALS — BP 138/97 | HR 100 | Temp 98.0°F | Wt 320.6 lb

## 2014-09-12 DIAGNOSIS — O0993 Supervision of high risk pregnancy, unspecified, third trimester: Secondary | ICD-10-CM

## 2014-09-12 LAB — POCT URINALYSIS DIP (DEVICE)
Bilirubin Urine: NEGATIVE
Glucose, UA: 250 mg/dL — AB
Ketones, ur: NEGATIVE mg/dL
LEUKOCYTES UA: NEGATIVE
NITRITE: NEGATIVE
PROTEIN: 100 mg/dL — AB
Specific Gravity, Urine: 1.02 (ref 1.005–1.030)
Urobilinogen, UA: 0.2 mg/dL (ref 0.0–1.0)
pH: 6 (ref 5.0–8.0)

## 2014-09-12 NOTE — Telephone Encounter (Signed)
Called patient phone, and her number was disconnected. Called mobile number that was the husband's, and left message for patient to call us about an appointment. Patient walked out without being seen for her appointment today per [facility].

## 2014-09-12 NOTE — Progress Notes (Signed)
Per Dr. Penne Lash, she would like pt to have a BPP scheduled before Wednesday.  Called the pt and informed her of provider's recommendation.  Pt stated she was not going to be able to come back to [facility] before Wednesday due to her husband working and childcare.  I explained to the pt the importance of the BPP. Pt stated " I understand that your concerned of me and baby but I have to do what is needed for my family."  She stated "transportation is an issue, my husband works, and I have my child; so I will not be able come."  Pt requested that she gets her OB appt/ NST/ BPP scheduled all on the same day.  I advised pt that I would speak with Diane and MFM to see if we can not get her appts scheduled in the same day and that I will call her with follow up.  Pt stated "that she greatly appreciated that".

## 2014-09-12 NOTE — Progress Notes (Signed)
Pt's call transferred from the front desk and pt was upset that the nurse informed her that her boyfriend "was not allowed to come back with her until after check in".   I explained to the patient the reason for why we request that visitors wait in the waiting room is so that we can establish a nurse-patient relationship for the check in process; we then will call your visitors back before we get baby's heart rate.  Pt continuing to be upset states "the last time I was there, the nurse did not say for my boyfriend to stay in the waiting room, why today?"  "My boyfriend thinks something is wrong that I am not telling him about my results from the last visit which I told him the nurse did not give me results."  Pt's boyfriend is in the background speaking to the pt and stating that he felt he was discriminated against.  He stated that " the nurse judged him on his appearance."  I apologize to the patient and her boyfriend continuously about what happened today.  Pt states " it's my right if I want my boyfriend to come back, that nurse had no right telling me that I can not bring my boyfriend back with me".  I again informed pt that we usually request patient first to check in then visitors.  I asked pt if she could come in today @ 1300 for NST and she stated "no, I do not want to come to this office again today; I don't want to do a NST when I am currently stressed."  I strongly advised pt to come in today for her NST so that baby can be monitored concerning with her BP and also her complaint of not feeling baby move.  Pt stated "no, I am not coming I have a MFM appt scheduled on Wednesday so I will go to that appt and I want to reschedule my OB appt."  OB appt scheduled for July 25th @ 0930, pt notified.  I informed pt that I would send her and her boyfriend's concern to our clinical manager; she will contact her.  Pt states understanding with no further statements

## 2014-09-12 NOTE — Progress Notes (Signed)
States she hasn't felt the baby move in months; When calling patient back to room, patient's husband came to the door with her. Explained that we check patient's in by themselves but we will come back to get you to listen to the baby's heart and before the doctor sees her. Patient's husband became very upset and starting to yell, stormed out the door and left lobby. Patient became upset stating he isn't going to come back to the room now and probably left completely. Explained process of checking patient's by themselves but that we call family back for the baby's heart check and before the doctor see's you. Patient continued to seem agitated throughout check in. Attempted to call patient's husband back before auscultating fetal heart rate but he was not in the lobby. Attempted to find fetal heart rate but was unsuccessful after 5 minutes and told patient the doctor will check when they see her. Patient's husband came into room still very aggressive and was arguing with patient asking "why he wasn't allowed to come back" and "what did she need to tell him". Asked patient to get undressed for exam and left room. After closing the door patient and her husband continued to argue and yell, minutes later patient exited room and left through lobby. Front office staff to call patient to see if she is coming back. Patient was not seen by provider

## 2014-09-12 NOTE — Progress Notes (Signed)
Patient has high risk pregnancy and needs monitoring for preeclampsia and DM.  If patient does not get testing she fails outpatient management.  BPP ordered STAT today.  Hopefully she will get BPP since it is not doe in our office (would go to MFM).  Can offer patient admission or testing at [facility], [facility] or [facility].  Addison Naegeli is calling patient with appointment for BPP.  She can also go to [facility].

## 2014-09-13 ENCOUNTER — Encounter: Payer: Self-pay | Admitting: Family Medicine

## 2014-09-13 ENCOUNTER — Telehealth: Payer: Self-pay

## 2014-09-13 DIAGNOSIS — O99323 Drug use complicating pregnancy, third trimester: Secondary | ICD-10-CM | POA: Insufficient documentation

## 2014-09-13 LAB — PRESCRIPTION MONITORING PROFILE (19 PANEL)
Amphetamine/Meth: NEGATIVE ng/mL
BUPRENORPHINE, URINE: NEGATIVE ng/mL
Barbiturate Screen, Urine: NEGATIVE ng/mL
Benzodiazepine Screen, Urine: NEGATIVE ng/mL
COCAINE METABOLITES: NEGATIVE ng/mL
CREATININE, URINE: 55.71 mg/dL (ref >20.0)
Cannabinoid Scrn, Ur: NEGATIVE ng/mL
Carisoprodol, Urine: NEGATIVE ng/mL
ECSTASY: NEGATIVE ng/mL
METHADONE SCREEN, URINE: NEGATIVE ng/mL
METHAQUALONE SCREEN (URINE): NEGATIVE ng/mL
Meperidine, Ur: NEGATIVE ng/mL
NITRITES URINE, INITIAL: NEGATIVE ug/mL
OPIATE SCREEN, URINE: NEGATIVE ng/mL
OXYCODONE SCRN UR: NEGATIVE ng/mL
PH URINE, INITIAL: 5.8 pH (ref 4.5–8.9)
PHENCYCLIDINE, UR: NEGATIVE ng/mL
Propoxyphene: NEGATIVE ng/mL
Tapentadol, urine: NEGATIVE ng/mL
Tramadol Scrn, Ur: NEGATIVE ng/mL
ZOLPIDEM, URINE: NEGATIVE ng/mL

## 2014-09-13 NOTE — Telephone Encounter (Addendum)
Called pt and informed pt that her appts have been scheduled for July 25th with MFM @ 1345 for BPP and her OB appt with NST @ 1500.  Pt stated "thank you".  She then asked why she is scheduled for a c-section on 09/16/14 when she was told by Dr. Shawnie Pons that she would have her c-section on 09/30/14.  Pt stated that it was okay to leave a detailed message on her husband's VM.

## 2014-09-13 NOTE — Telephone Encounter (Signed)
Called pt x LM on VM on 09/12/14 and husband picked up phone today and stated she is sleeping.  I informed him that I have appts scheduled for her as I informed her that I would, if I could tell him the appt times or if I can have her to call the office back.  Pt's husband stated "no, I will tell her to call the office back."  I informed the husband that I would be here until 1700.  Husband stated that she has our number from caller ID and that we will have her call us back.

## 2014-09-14 ENCOUNTER — Ambulatory Visit (HOSPITAL_COMMUNITY): Payer: BLUE CROSS/BLUE SHIELD | Attending: Family Medicine

## 2014-09-14 ENCOUNTER — Other Ambulatory Visit (HOSPITAL_COMMUNITY): Payer: BLUE CROSS/BLUE SHIELD

## 2014-09-14 ENCOUNTER — Other Ambulatory Visit: Payer: Self-pay | Admitting: Obstetrics & Gynecology

## 2014-09-14 NOTE — Telephone Encounter (Signed)
Looking at pts chart, per Dr.Pratt pt c-section was scheduled for 09/16/14 due to pre-eclampsia, diabetes, poly.  I called pt and LM on VM as she gave permission to do so, stating that her c-section had been moved from the originated date of discussion of 09/30/14 to 09/16/14 due to diagnoses as stated above.  I also stated that the appt that she had scheduled on Monday, 09/12/14, the provider would have been able to inform her of the c-section date change.  If she has any questions please call the office but to please go to her c-section appt scheduled for 09/16/14.

## 2014-09-15 ENCOUNTER — Other Ambulatory Visit: Payer: Self-pay | Admitting: Obstetrics & Gynecology

## 2014-09-15 ENCOUNTER — Telehealth: Payer: Self-pay | Admitting: *Deleted

## 2014-09-15 MED ORDER — CEFAZOLIN SODIUM 10 G IJ SOLR
3.0000 g | INTRAMUSCULAR | Status: DC
  Filled 2014-09-15: qty 3000

## 2014-09-15 NOTE — Telephone Encounter (Signed)
Received phone call from Morgan Allen stating patient has missed pre-op appointment for today for scheduled c/s 09/16/14.  States patient says she is not coming for c/s 09/16/14.  States she has appointment in clinic on Monday 09/19/14 with Dr. Shawnie Pons.

## 2014-09-16 ENCOUNTER — Inpatient Hospital Stay (HOSPITAL_COMMUNITY)
Admission: RE | Admit: 2014-09-16 | Discharge: 2014-09-19 | DRG: 774 | Disposition: A | Discharge status: Home / Self Care | Payer: BLUE CROSS/BLUE SHIELD | Source: Ambulatory Visit | Attending: Obstetrics & Gynecology | Admitting: Obstetrics & Gynecology

## 2014-09-16 ENCOUNTER — Inpatient Hospital Stay (HOSPITAL_COMMUNITY): Payer: BLUE CROSS/BLUE SHIELD | Admitting: Anesthesiology

## 2014-09-16 ENCOUNTER — Inpatient Hospital Stay (HOSPITAL_COMMUNITY): Payer: BLUE CROSS/BLUE SHIELD

## 2014-09-16 ENCOUNTER — Encounter (HOSPITAL_COMMUNITY): Payer: Self-pay

## 2014-09-16 ENCOUNTER — Encounter (HOSPITAL_COMMUNITY)
Admission: RE | Disposition: A | Discharge status: Home / Self Care | Payer: Self-pay | Source: Ambulatory Visit | Attending: Obstetrics & Gynecology

## 2014-09-16 DIAGNOSIS — O99214 Obesity complicating childbirth: Secondary | ICD-10-CM | POA: Diagnosis present

## 2014-09-16 DIAGNOSIS — O09523 Supervision of elderly multigravida, third trimester: Secondary | ICD-10-CM

## 2014-09-16 DIAGNOSIS — Z3A37 37 weeks gestation of pregnancy: Secondary | ICD-10-CM | POA: Diagnosis present

## 2014-09-16 DIAGNOSIS — IMO0002 Reserved for concepts with insufficient information to code with codable children: Secondary | ICD-10-CM | POA: Diagnosis present

## 2014-09-16 DIAGNOSIS — E119 Type 2 diabetes mellitus without complications: Secondary | ICD-10-CM | POA: Diagnosis present

## 2014-09-16 DIAGNOSIS — Z87891 Personal history of nicotine dependence: Secondary | ICD-10-CM | POA: Diagnosis not present

## 2014-09-16 DIAGNOSIS — N858 Other specified noninflammatory disorders of uterus: Secondary | ICD-10-CM | POA: Diagnosis present

## 2014-09-16 DIAGNOSIS — B951 Streptococcus, group B, as the cause of diseases classified elsewhere: Secondary | ICD-10-CM

## 2014-09-16 DIAGNOSIS — O2412 Pre-existing diabetes mellitus, type 2, in childbirth: Secondary | ICD-10-CM | POA: Diagnosis present

## 2014-09-16 DIAGNOSIS — O364XX0 Maternal care for intrauterine death, not applicable or unspecified: Secondary | ICD-10-CM | POA: Diagnosis present

## 2014-09-16 DIAGNOSIS — O133 Gestational [pregnancy-induced] hypertension without significant proteinuria, third trimester: Secondary | ICD-10-CM | POA: Diagnosis not present

## 2014-09-16 DIAGNOSIS — Z3A36 36 weeks gestation of pregnancy: Secondary | ICD-10-CM | POA: Diagnosis present

## 2014-09-16 DIAGNOSIS — Z9119 Patient's noncompliance with other medical treatment and regimen: Secondary | ICD-10-CM

## 2014-09-16 DIAGNOSIS — Z79899 Other long term (current) drug therapy: Secondary | ICD-10-CM

## 2014-09-16 DIAGNOSIS — O34219 Maternal care for unspecified type scar from previous cesarean delivery: Secondary | ICD-10-CM | POA: Diagnosis not present

## 2014-09-16 DIAGNOSIS — O403XX0 Polyhydramnios, third trimester, not applicable or unspecified: Secondary | ICD-10-CM | POA: Diagnosis present

## 2014-09-16 DIAGNOSIS — O24119 Pre-existing diabetes mellitus, type 2, in pregnancy, unspecified trimester: Secondary | ICD-10-CM

## 2014-09-16 DIAGNOSIS — O09893 Supervision of other high risk pregnancies, third trimester: Secondary | ICD-10-CM

## 2014-09-16 DIAGNOSIS — O1403 Mild to moderate pre-eclampsia, third trimester: Secondary | ICD-10-CM | POA: Diagnosis present

## 2014-09-16 DIAGNOSIS — O0993 Supervision of high risk pregnancy, unspecified, third trimester: Secondary | ICD-10-CM

## 2014-09-16 DIAGNOSIS — Z6841 Body Mass Index (BMI) 40.0 and over, adult: Secondary | ICD-10-CM | POA: Diagnosis not present

## 2014-09-16 DIAGNOSIS — Z283 Underimmunization status: Secondary | ICD-10-CM

## 2014-09-16 DIAGNOSIS — Z91199 Patient's noncompliance with other medical treatment and regimen due to unspecified reason: Secondary | ICD-10-CM

## 2014-09-16 DIAGNOSIS — O3421 Maternal care for scar from previous cesarean delivery: Secondary | ICD-10-CM | POA: Diagnosis present

## 2014-09-16 DIAGNOSIS — O9989 Other specified diseases and conditions complicating pregnancy, childbirth and the puerperium: Secondary | ICD-10-CM

## 2014-09-16 DIAGNOSIS — O2343 Unspecified infection of urinary tract in pregnancy, third trimester: Secondary | ICD-10-CM

## 2014-09-16 DIAGNOSIS — Z3490 Encounter for supervision of normal pregnancy, unspecified, unspecified trimester: Secondary | ICD-10-CM

## 2014-09-16 DIAGNOSIS — O09899 Supervision of other high risk pregnancies, unspecified trimester: Secondary | ICD-10-CM

## 2014-09-16 LAB — CBC
HCT: 37.5 % (ref 36.0–46.0)
HEMATOCRIT: 35 % — AB (ref 36.0–46.0)
HEMOGLOBIN: 11.8 g/dL — AB (ref 12.0–15.0)
Hemoglobin: 13 g/dL (ref 12.0–15.0)
MCH: 31 pg (ref 26.0–34.0)
MCH: 30.3 pg (ref 26.0–34.0)
MCHC: 34.7 g/dL (ref 30.0–36.0)
MCHC: 33.7 g/dL (ref 30.0–36.0)
MCV: 89.3 fL (ref 78.0–100.0)
MCV: 89.7 fL (ref 78.0–100.0)
PLATELETS: 299 10*3/uL (ref 150–400)
Platelets: 278 10*3/uL (ref 150–400)
RBC: 4.2 MIL/uL (ref 3.87–5.11)
RBC: 3.9 MIL/uL (ref 3.87–5.11)
RDW: 13.8 % (ref 11.5–15.5)
RDW: 13.8 % (ref 11.5–15.5)
WBC: 9.9 10*3/uL (ref 4.0–10.5)
WBC: 10 10*3/uL (ref 4.0–10.5)

## 2014-09-16 LAB — BASIC METABOLIC PANEL
Anion gap: 5 (ref 5–15)
BUN: 6 mg/dL (ref 6–20)
CO2: 22 mmol/L (ref 22–32)
Calcium: 8.4 mg/dL — ABNORMAL LOW (ref 8.9–10.3)
Chloride: 109 mmol/L (ref 101–111)
Creatinine, Ser: 0.59 mg/dL (ref 0.44–1.00)
GFR calc Af Amer: >60 mL/min (ref >60)
GFR calc non Af Amer: >60 mL/min (ref >60)
Glucose, Bld: 159 mg/dL — ABNORMAL HIGH (ref 65–99)
Potassium: 3.7 mmol/L (ref 3.5–5.1)
Sodium: 136 mmol/L (ref 135–145)

## 2014-09-16 LAB — TYPE AND SCREEN
ABO/RH(D): O POS
Antibody Screen: NEGATIVE

## 2014-09-16 LAB — GLUCOSE, CAPILLARY
Glucose-Capillary: 146 mg/dL — ABNORMAL HIGH (ref 65–99)
Glucose-Capillary: 118 mg/dL — ABNORMAL HIGH (ref 65–99)
Glucose-Capillary: 89 mg/dL (ref 65–99)

## 2014-09-16 LAB — ABO/RH: ABO/RH(D): O POS

## 2014-09-16 LAB — ALT: ALT: 19 U/L (ref 14–54)

## 2014-09-16 LAB — AST: AST: 19 U/L (ref 15–41)

## 2014-09-16 SURGERY — Surgical Case
Anesthesia: Spinal

## 2014-09-16 MED ORDER — LACTATED RINGERS IV SOLN: INTRAVENOUS | Status: DC

## 2014-09-16 MED ORDER — OXYTOCIN 40 UNITS IN LACTATED RINGERS INFUSION - SIMPLE MED
1.0000 m[IU]/min | INTRAVENOUS | Status: DC
  Administered 2014-09-16: 2 m[IU]/min via INTRAVENOUS
  Filled 2014-09-16: qty 1000

## 2014-09-16 MED ORDER — KETOROLAC TROMETHAMINE 30 MG/ML IJ SOLN: 30.0000 mg | Freq: Four times a day (QID) | INTRAMUSCULAR | Status: DC | PRN

## 2014-09-16 MED ORDER — OXYTOCIN 40 UNITS IN LACTATED RINGERS INFUSION - SIMPLE MED
1.0000 m[IU]/min | INTRAVENOUS | Status: DC
  Filled 2014-09-16: qty 1000

## 2014-09-16 MED ORDER — OXYTOCIN BOLUS FROM INFUSION
500.0000 mL | INTRAVENOUS | Status: DC
  Administered 2014-09-18: 500 mL via INTRAVENOUS

## 2014-09-16 MED ORDER — TERBUTALINE SULFATE 1 MG/ML IJ SOLN: 0.2500 mg | Freq: Once | INTRAMUSCULAR | Status: AC | PRN

## 2014-09-16 MED ORDER — LIDOCAINE HCL (PF) 1 % IJ SOLN
30.0000 mL | INTRAMUSCULAR | Status: DC | PRN
  Filled 2014-09-16: qty 30

## 2014-09-16 MED ORDER — ONDANSETRON HCL 4 MG/2ML IJ SOLN
INTRAMUSCULAR | Status: AC
  Filled 2014-09-16: qty 2

## 2014-09-16 MED ORDER — LACTATED RINGERS IV SOLN
500.0000 mL | INTRAVENOUS | Status: DC | PRN
  Administered 2014-09-17: 500 mL via INTRAVENOUS

## 2014-09-16 MED ORDER — LABETALOL HCL 5 MG/ML IV SOLN
20.0000 mg | INTRAVENOUS | Status: AC | PRN
  Administered 2014-09-18: 20 mg via INTRAVENOUS
  Administered 2014-09-18: 40 mg via INTRAVENOUS
  Filled 2014-09-16: qty 4
  Filled 2014-09-16: qty 8

## 2014-09-16 MED ORDER — OXYTOCIN 10 UNIT/ML IJ SOLN
INTRAMUSCULAR | Status: AC
  Filled 2014-09-16: qty 4

## 2014-09-16 MED ORDER — ONDANSETRON HCL 4 MG/2ML IJ SOLN
4.0000 mg | Freq: Four times a day (QID) | INTRAMUSCULAR | Status: DC | PRN
  Administered 2014-09-17: 4 mg via INTRAVENOUS
  Filled 2014-09-16: qty 2

## 2014-09-16 MED ORDER — SCOPOLAMINE 1 MG/3DAYS TD PT72
MEDICATED_PATCH | TRANSDERMAL | Status: AC
  Administered 2014-09-16: 1.5 mg via TRANSDERMAL
  Filled 2014-09-16: qty 1

## 2014-09-16 MED ORDER — OXYCODONE-ACETAMINOPHEN 5-325 MG PO TABS: 1.0000 | ORAL_TABLET | ORAL | Status: DC | PRN

## 2014-09-16 MED ORDER — ZOLPIDEM TARTRATE 5 MG PO TABS
10.0000 mg | ORAL_TABLET | Freq: Every evening | ORAL | Status: DC | PRN
  Administered 2014-09-16: 10 mg via ORAL
  Filled 2014-09-16: qty 2

## 2014-09-16 MED ORDER — HYDRALAZINE HCL 20 MG/ML IJ SOLN
5.0000 mg | INTRAMUSCULAR | Status: DC | PRN
  Administered 2014-09-16: 5 mg via INTRAVENOUS
  Filled 2014-09-16 (×2): qty 1

## 2014-09-16 MED ORDER — LACTATED RINGERS IV SOLN
Freq: Once | INTRAVENOUS | Status: AC
  Administered 2014-09-16: 14:00:00 via INTRAVENOUS

## 2014-09-16 MED ORDER — FENTANYL CITRATE (PF) 100 MCG/2ML IJ SOLN
INTRAMUSCULAR | Status: AC
  Filled 2014-09-16: qty 2

## 2014-09-16 MED ORDER — LACTATED RINGERS IV SOLN
INTRAVENOUS | Status: DC
  Administered 2014-09-16 – 2014-09-17 (×3): via INTRAVENOUS

## 2014-09-16 MED ORDER — OXYCODONE-ACETAMINOPHEN 5-325 MG PO TABS: 2.0000 | ORAL_TABLET | ORAL | Status: DC | PRN

## 2014-09-16 MED ORDER — PHENYLEPHRINE 8 MG IN D5W 100 ML (0.08MG/ML) PREMIX OPTIME
INJECTION | INTRAVENOUS | Status: AC
  Filled 2014-09-16: qty 100

## 2014-09-16 MED ORDER — CITRIC ACID-SODIUM CITRATE 334-500 MG/5ML PO SOLN
30.0000 mL | ORAL | Status: DC | PRN
  Filled 2014-09-16: qty 30

## 2014-09-16 MED ORDER — ACETAMINOPHEN 325 MG PO TABS: 650.0000 mg | ORAL_TABLET | ORAL | Status: DC | PRN

## 2014-09-16 MED ORDER — MORPHINE SULFATE 0.5 MG/ML IJ SOLN
INTRAMUSCULAR | Status: AC
  Filled 2014-09-16: qty 10

## 2014-09-16 MED ORDER — LABETALOL HCL 100 MG PO TABS
100.0000 mg | ORAL_TABLET | Freq: Two times a day (BID) | ORAL | Status: DC
  Administered 2014-09-16 – 2014-09-19 (×5): 100 mg via ORAL
  Filled 2014-09-16 (×7): qty 1

## 2014-09-16 MED ORDER — OXYTOCIN 40 UNITS IN LACTATED RINGERS INFUSION - SIMPLE MED
62.5000 mL/h | INTRAVENOUS | Status: DC
  Administered 2014-09-18: 62.5 mL/h via INTRAVENOUS

## 2014-09-16 MED ORDER — SCOPOLAMINE 1 MG/3DAYS TD PT72
1.0000 | MEDICATED_PATCH | Freq: Once | TRANSDERMAL | Status: DC
  Administered 2014-09-16: 1.5 mg via TRANSDERMAL

## 2014-09-16 SURGICAL SUPPLY — 31 items
BARRIER ADHS 3X4 INTERCEED (GAUZE/BANDAGES/DRESSINGS) IMPLANT
BRR ADH 4X3 ABS CNTRL BYND (GAUZE/BANDAGES/DRESSINGS)
CLAMP CORD UMBIL (MISCELLANEOUS) IMPLANT
CLOTH BEACON ORANGE TIMEOUT ST (SAFETY) ×1 IMPLANT
DRAPE SHEET LG 3/4 BI-LAMINATE (DRAPES) IMPLANT
DRSG OPSITE POSTOP 4X10 (GAUZE/BANDAGES/DRESSINGS) ×1 IMPLANT
DURAPREP 26ML APPLICATOR (WOUND CARE) ×1 IMPLANT
ELECT REM PT RETURN 9FT ADLT (ELECTROSURGICAL)
ELECTRODE REM PT RTRN 9FT ADLT (ELECTROSURGICAL) ×1 IMPLANT
EXTRACTOR VACUUM KIWI (MISCELLANEOUS) IMPLANT
GLOVE BIO SURGEON STRL SZ 6.5 (GLOVE) ×1 IMPLANT
GLOVE BIO SURGEONS STRL SZ 6.5 (GLOVE)
GLOVE BIOGEL PI IND STRL 7.0 (GLOVE) ×1 IMPLANT
GLOVE BIOGEL PI INDICATOR 7.0 (GLOVE)
GOWN STRL REUS W/TWL LRG LVL3 (GOWN DISPOSABLE) ×2 IMPLANT
KIT ABG SYR 3ML LUER SLIP (SYRINGE) IMPLANT
NDL HYPO 25X5/8 SAFETYGLIDE (NEEDLE) IMPLANT
NEEDLE HYPO 22GX1.5 SAFETY (NEEDLE) IMPLANT
NEEDLE HYPO 25X5/8 SAFETYGLIDE (NEEDLE) IMPLANT
NS IRRIG 1000ML POUR BTL (IV SOLUTION) ×1 IMPLANT
PACK C SECTION WH (CUSTOM PROCEDURE TRAY) ×1 IMPLANT
PAD OB MATERNITY 4.3X12.25 (PERSONAL CARE ITEMS) ×1 IMPLANT
RETRACTOR WND ALEXIS 25 LRG (MISCELLANEOUS) IMPLANT
RTRCTR WOUND ALEXIS 25CM LRG (MISCELLANEOUS)
SUT VIC AB 0 CT1 36 (SUTURE) ×6 IMPLANT
SUT VIC AB 2-0 CT1 27 (SUTURE)
SUT VIC AB 2-0 CT1 TAPERPNT 27 (SUTURE) ×1 IMPLANT
SUT VIC AB 4-0 PS2 27 (SUTURE) ×1 IMPLANT
SYR CONTROL 10ML LL (SYRINGE) IMPLANT
TOWEL OR 17X24 6PK STRL BLUE (TOWEL DISPOSABLE) ×1 IMPLANT
TRAY FOLEY CATH SILVER 14FR (SET/KITS/TRAYS/PACK) IMPLANT

## 2014-09-16 NOTE — Progress Notes (Signed)
Morgan Allen is a 34 y.o. G2P1001 at [redacted]w[redacted]d  admitted for induction of labor due to fetal demise in utero, Diabetes mellitus, polyhydramnios, morbid obesity, prior cesarean for tolac, now on Pitocin at 8 mu/min. Foley bulb placed in cervix by me.  Subjective:pt having no contractions by toco or pt perception.   Objective: BP 106/48 mmHg  Pulse 94  Temp(Src) 98.7 F (37.1 C) (Oral)  Resp 18  Ht 5\' 1"  (1.549 m)  Wt 145.151 kg (320 lb)  BMI 60.49 kg/m2  SpO2 99%  LMP 12/31/2013      FHT:  absent UC:   none SVE:   Dilation: 1.5 Effacement (%): 40 Exam by:: Dr. Emelda Fear -2 station.  Vertex presentation, by u/s by me now Labs: Lab Results  Component Value Date   WBC 10.0 09/16/2014   HGB 11.8* 09/16/2014   HCT 35.0* 09/16/2014   MCV 89.7 09/16/2014   PLT 278 09/16/2014   CBG (last 3)   Recent Labs  09/16/14 1303 09/16/14 1734 09/16/14 2126  GLUCAP 146* 118* 89      Assessment / Plan: IOL for FDIU, Tolac, now in cervical ripening phase. A2/B diabetes  Normal cbg's not currently on any meds. Labor: foley bulb placed in cervix, easily, and will continue pitocin at current doses thru night Preeclampsia:   Fetal Wellbeing:   Pain Control:  Labor support without medications I/D:  n/a Anticipated MOD:  NSVD  Sheldon Sem V 09/16/2014, 11:39 PM

## 2014-09-16 NOTE — Plan of Care (Signed)
Problem: Consults Goal: Chaplain Consult Outcome: Completed/Met Date Met:  09/16/14 Ordered placed for Chaplain consult. Patient refused to see chaplain after multiple attempts to offer

## 2014-09-16 NOTE — Anesthesia Preprocedure Evaluation (Signed)
Anesthesia Evaluation  Patient identified by MRN, date of birth, ID band Patient awake    Reviewed: Allergy & Precautions, H&P , NPO status , Patient's Chart, lab work & pertinent test results  Airway Mallampati: III  TM Distance: >3 FB Neck ROM: full    Dental no notable dental hx.    Pulmonary former smoker,    Pulmonary exam normal       Cardiovascular hypertension, Normal cardiovascular exam    Neuro/Psych negative neurological ROS     GI/Hepatic negative GI ROS, Neg liver ROS,   Endo/Other  diabetes, Well Controlled, GestationalMorbid obesity  Renal/GU negative Renal ROS     Musculoskeletal   Abdominal (+) + obese,   Peds  Hematology negative hematology ROS (+)   Anesthesia Other Findings   Reproductive/Obstetrics (+) Pregnancy                             Anesthesia Physical Anesthesia Plan  ASA: III  Anesthesia Plan: Combined Spinal and Epidural   Post-op Pain Management:    Induction:   Airway Management Planned:   Additional Equipment:   Intra-op Plan:   Post-operative Plan:   Informed Consent: I have reviewed the patients History and Physical, chart, labs and discussed the procedure including the risks, benefits and alternatives for the proposed anesthesia with the patient or authorized representative who has indicated his/her understanding and acceptance.     Plan Discussed with: CRNA and Surgeon  Anesthesia Plan Comments:         Anesthesia Quick Evaluation

## 2014-09-16 NOTE — Op Note (Signed)
In charge of pt pre-op getting ready for surgery.  Pt refuses to have husband leave side so he was present through whole short stay visit.  Psychosocial and suicidal questions not addressed. Skin in perineal area red and irritated due to increased BMI.  BP elevated but  pt denies vision changes or URQ pain. Had HA this week but relieved c tylenol.  States "hasnt felt baby move in months due to extra amniotic fluid".  Unable to find FHTs due to BMI and "polyhydamnios. Pt easily irritated c husband and at times beligerent.  Husband is quiet and polite.   No issues c him at all. Kashonda Sarkisyan RN

## 2014-09-16 NOTE — Pre-Procedure Instructions (Signed)
Called patient for pre op instructions for surgery on 09/16/14. Patient stated " My husband is going to stay with me the whole time I am here. From the time I enter the door and never leave my side" . I explained the process we will need approximately 30 mins. to get her ready and he will be with her from that point on. I could hear the husband in the background talking to her and making comments. She said" he will not leave my side because in the past the nurse lied to me and he missed things." I tried to reassure her he will miss nothing . I will be here in that department and will be part of the team getting her ready and will make every effort he is with her. She informed me she talked to the president of this hospital and he can be with her at all times. She also informed me she will speak to the president named "Arline Asp" before her admission today. She basically hung up the phone before I could finish pre op instructions with meds and etc. Her husband was being heard in the background most of the time. Donney Dice, Dir of CRNAs of anesthesia was notified .She will handle this when patient arrives. Matilde Bash, RN

## 2014-09-16 NOTE — H&P (Signed)
Morgan Allen is a 34 y.o. female presenting for repeat cesarean section, G2P1002 Patient's last menstrual period was 12/31/2013. She has felt no FM for weeks but had reassuring fetal testing [redacted]w[redacted]d Preeclampsia. Maternal Medical History:  Reason for admission: Repeat cesarean section  Fetal activity: Perceived fetal activity is none.   Has not felt FM for 2 months  Prenatal complications: Pre-eclampsia (mild).   Prenatal Complications - Diabetes: type 2. Diabetes is managed by oral agent (monotherapy).      OB History    Gravida Para Term Preterm AB TAB SAB Ectopic Multiple Living   Past Medical History  Diagnosis Date  . Pregnant   . Gestational diabetes   . Pregnancy induced hypertension   . Obesity    Past Surgical History  Procedure Laterality Date  . Cesarean section     Family History: family history includes Cancer in her paternal grandmother; Diabetes in her father; Hypertension in her father; Stroke in her father. Social History:  reports that she has quit smoking. She does not have any smokeless tobacco history on file. She reports that she does not drink alcohol or use illicit drugs.   Prenatal Transfer Tool  Maternal Diabetes: Yes:  Diabetes Type:  Pre-pregnancy Genetic Screening: Normal Maternal Ultrasounds/Referrals: Normal Fetal Ultrasounds or other Referrals:  None nl fetal echo Maternal Substance Abuse:  No Significant Maternal Medications:  Meds include: Other: glyburide Significant Maternal Lab Results:  None Other Comments:  None  Review of Systems  Constitutional: Negative.   Gastrointestinal: Negative.   Genitourinary: Negative.   Musculoskeletal: Negative.       Blood pressure 163/82, pulse 134, temperature 98.6 F (37 C), temperature source Oral, resp. rate 22, height  (1.549 m), last menstrual period 12/31/2013, SpO2 99 %. Maternal Exam:  Abdomen: Patient reports no abdominal tenderness.   Physical Exam   Constitutional: She is oriented to person, place, and time. She appears well-developed.  obese  Cardiovascular: Normal rate.   Respiratory: Effort normal. No respiratory distress.  GI: Soft. There is no tenderness.  Large pannous  Neurological: She is alert and oriented to person, place, and time.  Skin: Skin is warm and dry.  Psychiatric: She has a normal mood and affect. Her behavior is normal.    Prenatal labs: ABO, Rh: --/--/O POS (07/22 1255) Antibody: NEG (07/22 1255) Rubella: 0.82 (07/11 1051) RPR: NON REAC (07/11 1051)  HBsAg: NEGATIVE (07/11 1051)  HIV: NONREACTIVE (07/11 1051)  GBS:     Assessment/Plan: Fetal demise diagnosed by [place] today, Dr. Sherrie George consulted and confirmed. We discussed that the risk of cesarean section is too high when there is no fetal benefit and that TOLAC is advised, which she accepts. FOB present for discussion and voiced understanding as well.     ARNOLD,JAMES 09/16/2014, 2:55 PM

## 2014-09-16 NOTE — Progress Notes (Signed)
Transferred to Labor and Delivery per stretcher for trial of labor after fetal demise was determined.  Husband will inform family of this . This is his request that  no one is to be with him.  Husband is cooperative. Patient is tearful but very cooperative. She seems to understand what Dr Debroah Loop has informed her . And when I questioned her if she had any questions she did not for now . She denied any help from Spiritual care.  Morgan Allen

## 2014-09-17 ENCOUNTER — Inpatient Hospital Stay (HOSPITAL_COMMUNITY): Payer: BLUE CROSS/BLUE SHIELD | Admitting: Anesthesiology

## 2014-09-17 DIAGNOSIS — Z9119 Patient's noncompliance with other medical treatment and regimen: Secondary | ICD-10-CM

## 2014-09-17 DIAGNOSIS — O09893 Supervision of other high risk pregnancies, third trimester: Secondary | ICD-10-CM

## 2014-09-17 DIAGNOSIS — Z91199 Patient's noncompliance with other medical treatment and regimen due to unspecified reason: Secondary | ICD-10-CM

## 2014-09-17 LAB — PROTIME-INR
INR: 1.09 (ref 0.00–1.49)
Prothrombin Time: 14.3 seconds (ref 11.6–15.2)

## 2014-09-17 LAB — RPR: RPR Ser Ql: NONREACTIVE

## 2014-09-17 LAB — SAVE SMEAR

## 2014-09-17 LAB — GLUCOSE, CAPILLARY
GLUCOSE-CAPILLARY: 99 mg/dL (ref 65–99)
GLUCOSE-CAPILLARY: 119 mg/dL — AB (ref 65–99)
GLUCOSE-CAPILLARY: 144 mg/dL — AB (ref 65–99)
GLUCOSE-CAPILLARY: 138 mg/dL — AB (ref 65–99)
Glucose-Capillary: 102 mg/dL — ABNORMAL HIGH (ref 65–99)
Glucose-Capillary: 117 mg/dL — ABNORMAL HIGH (ref 65–99)

## 2014-09-17 LAB — FIBRINOGEN: FIBRINOGEN: 498 mg/dL — AB (ref 204–475)

## 2014-09-17 LAB — PLATELET COUNT: Platelets: 253 10*3/uL (ref 150–400)

## 2014-09-17 LAB — APTT: aPTT: 26 seconds (ref 24–37)

## 2014-09-17 MED ORDER — BUPIVACAINE HCL (PF) 0.25 % IJ SOLN
INTRAMUSCULAR | Status: DC | PRN
Start: 2014-09-17 — End: 2014-09-18
  Administered 2014-09-17 (×2): 4 mL

## 2014-09-17 MED ORDER — FENTANYL CITRATE (PF) 100 MCG/2ML IJ SOLN
100.0000 ug | INTRAMUSCULAR | Status: DC | PRN
  Administered 2014-09-17 – 2014-09-18 (×10): 100 ug via INTRAVENOUS
  Filled 2014-09-17 (×10): qty 2

## 2014-09-17 MED ORDER — NALBUPHINE HCL 10 MG/ML IJ SOLN
10.0000 mg | INTRAMUSCULAR | Status: DC | PRN
  Filled 2014-09-17: qty 1

## 2014-09-17 MED ORDER — PROMETHAZINE HCL 25 MG/ML IJ SOLN
12.5000 mg | INTRAMUSCULAR | Status: DC | PRN
  Filled 2014-09-17: qty 1

## 2014-09-17 MED ORDER — LIDOCAINE-EPINEPHRINE (PF) 2 %-1:200000 IJ SOLN
INTRAMUSCULAR | Status: DC | PRN
  Administered 2014-09-17: 4 mL

## 2014-09-17 MED ORDER — PHENYLEPHRINE 40 MCG/ML (10ML) SYRINGE FOR IV PUSH (FOR BLOOD PRESSURE SUPPORT)
80.0000 ug | PREFILLED_SYRINGE | INTRAVENOUS | Status: DC | PRN
Start: 2014-09-17 — End: 2014-09-18
  Filled 2014-09-17: qty 2
  Filled 2014-09-17: qty 20

## 2014-09-17 MED ORDER — FENTANYL 2.5 MCG/ML BUPIVACAINE 1/10 % EPIDURAL INFUSION (WH - ANES)
14.0000 mL/h | INTRAMUSCULAR | Status: DC | PRN
  Administered 2014-09-17: 12 mL/h via EPIDURAL
  Administered 2014-09-18: 14 mL/h via EPIDURAL
  Filled 2014-09-17 (×2): qty 125

## 2014-09-17 MED ORDER — DIPHENHYDRAMINE HCL 50 MG/ML IJ SOLN: 12.5000 mg | INTRAMUSCULAR | Status: DC | PRN

## 2014-09-17 MED ORDER — EPHEDRINE 5 MG/ML INJ
10.0000 mg | INTRAVENOUS | Status: DC | PRN
Start: 2014-09-17 — End: 2014-09-18
  Filled 2014-09-17: qty 2

## 2014-09-17 NOTE — Progress Notes (Signed)
Patient ID: [name], female   DOB: 1981-01-12, 34 y.o.   MRN: [id number] Morgan Allen is a 34 y.o. G2P1001 at [redacted]w[redacted]d by ultrasound admitted for IOL 2/2 IUFD, DM, polyhydramnios, morbid obesity, and prior C/S for TOLAC.  Subjective: Pt is comfortable with occasional sharp abdomen ctx.   Objective: BP 128/67 mmHg  Pulse 96  Temp(Src) 98.2 F (36.8 C) (Oral)  Resp 18  Ht 5\' 1"  (1.549 m)  Wt 145.151 kg (320 lb)  BMI 60.49 kg/m2  SpO2 99%  LMP 12/31/2013      FHT:  FHR: n/a bpm, variability: n/a, accelerations: Abscent, decelerations: Present IUFD UC:   none SVE:   Dilation: 4.5 Effacement (%): 40 Exam by:: lawson  Labs: Lab Results  Component Value Date   WBC 10.0 09/16/2014   HGB 11.8* 09/16/2014   HCT 35.0* 09/16/2014   MCV 89.7 09/16/2014   PLT 253 09/17/2014    Assessment / Plan: Induction of labor due to fetal demise,  progressing well on pitocin  Labor: Progressing s/p AROM Fetal Wellbeing:  n/a Pain Control:  Labor support without medications Anticipated MOD:  NSVD  Morgan Allen 09/17/2014, 2:50 PM

## 2014-09-17 NOTE — Progress Notes (Signed)
Morgan Allen is a 34 y.o. G2P1001 at [redacted]w[redacted]d by ultrasound admitted for induction of labor due to fetal demise.  Subjective:   Objective: BP 146/80 mmHg  Pulse 97  Temp(Src) 98.2 F (36.8 C) (Oral)  Resp 20  Ht 5\' 1"  (1.549 m)  Wt 320 lb (145.151 kg)  BMI 60.49 kg/m2  SpO2 99%  LMP 12/31/2013      FHT:  FHR: n/a bpm, variability: n/a,  accelerations:  Abscent,  decelerations:  Absent UC:   4-6 SVE:   Dilation: 4.5 Effacement (%): 40 Exam by:: Morgan Allen  Labs: Lab Results  Component Value Date   WBC 10.0 09/16/2014   HGB 11.8* 09/16/2014   HCT 35.0* 09/16/2014   MCV 89.7 09/16/2014   PLT 253 09/17/2014    Assessment / Plan: Induction of labor due to fetal demise,  progressing well on pitocin  Labor: Progressing normally Preeclampsia:  no signs or symptoms of toxicity and intake and ouput balanced Fetal Wellbeing:  n/a Pain Control:  Fentanyl I/D:  n/a Anticipated MOD:  NSVD  Morgan Allen Morgan Allen 09/17/2014, 5:26 PM

## 2014-09-17 NOTE — Anesthesia Preprocedure Evaluation (Signed)
Anesthesia Evaluation  Patient identified by MRN, date of birth, ID band Patient awake    Reviewed: Allergy & Precautions, Patient's Chart, lab work & pertinent test results  History of Anesthesia Complications Negative for: history of anesthetic complications  Airway Mallampati: III  TM Distance: >3 FB Neck ROM: Full    Dental  (+) Teeth Intact   Pulmonary neg pulmonary ROS, neg recent URI, former smoker,  breath sounds clear to auscultation        Cardiovascular hypertension, Rhythm:Regular     Neuro/Psych negative neurological ROS  negative psych ROS   GI/Hepatic negative GI ROS, Neg liver ROS,   Endo/Other  diabetes, GestationalMorbid obesity  Renal/GU negative Renal ROS     Musculoskeletal   Abdominal   Peds  Hematology negative hematology ROS (+)   Anesthesia Other Findings   Reproductive/Obstetrics (+) Pregnancy                             Anesthesia Physical Anesthesia Plan  ASA: III  Anesthesia Plan: Epidural   Post-op Pain Management:    Induction:   Airway Management Planned:   Additional Equipment:   Intra-op Plan:   Post-operative Plan:   Informed Consent: I have reviewed the patients History and Physical, chart, labs and discussed the procedure including the risks, benefits and alternatives for the proposed anesthesia with the patient or authorized representative who has indicated his/her understanding and acceptance.   Dental advisory given  Plan Discussed with: Anesthesiologist  Anesthesia Plan Comments:         Anesthesia Quick Evaluation

## 2014-09-17 NOTE — Anesthesia Procedure Notes (Signed)
Epidural Patient location during procedure: OB  Staffing Anesthesiologist: Earland Reish, CHRIS Performed by: anesthesiologist   Preanesthetic Checklist Completed: patient identified, surgical consent, pre-op evaluation, timeout performed, IV checked, risks and benefits discussed and monitors and equipment checked  Epidural Patient position: sitting Prep: DuraPrep Patient monitoring: heart rate, cardiac monitor, continuous pulse ox and blood pressure Approach: midline Location: L3-L4 Injection technique: LOR saline  Needle:  Needle type: Tuohy  Needle gauge: 17 G Needle length: 9 cm Needle insertion depth: 9 cm Catheter type: closed end flexible Catheter size: 19 Gauge Catheter at skin depth: 15 cm Test dose: negative and 2% lidocaine with Epi 1:200 K  Assessment Events: blood not aspirated, injection not painful, no injection resistance, negative IV test and no paresthesia  Additional Notes Reason for block:procedure for pain   

## 2014-09-17 NOTE — Progress Notes (Signed)
Morgan Allen is a 34 y.o. G2P1001 at [redacted]w[redacted]d by ultrasound admitted for induction of labor due to IUFD.  Subjective:   Objective: BP 132/86 mmHg  Pulse 109  Temp(Src) 98.8 F (37.1 C) (Oral)  Resp 20  Ht  (1.549 m)  Wt 320 lb (145.151 kg)  BMI 60.49 kg/m2  SpO2 99%  LMP 12/31/2013      FHT:  FHR: n/a bpm, variability: n/a,  accelerations:  Abscent,  decelerations:  Present IUFD UC:   Occasional, mild SVE:   Dilation: 1.5 Effacement (%): 40 Exam by:: Dr. Emelda Fear  Labs: Lab Results  Component Value Date   WBC 10.0 09/16/2014   HGB 11.8* 09/16/2014   HCT 35.0* 09/16/2014   MCV 89.7 09/16/2014   PLT 253 09/17/2014    Assessment / Plan: Induction of labor due to fetal demise,  progressing well on pitocin  Labor: no yet in labor Preeclampsia:  no signs or symptoms of toxicity and intake and ouput balanced Fetal Wellbeing:  n/a Pain Control:  Labor support without medications I/D:  n/a Anticipated MOD:  NSVD  Morgan Allen 09/17/2014, 11:27 AM

## 2014-09-18 ENCOUNTER — Encounter (HOSPITAL_COMMUNITY): Payer: Self-pay | Admitting: *Deleted

## 2014-09-18 DIAGNOSIS — O99214 Obesity complicating childbirth: Secondary | ICD-10-CM

## 2014-09-18 DIAGNOSIS — E119 Type 2 diabetes mellitus without complications: Secondary | ICD-10-CM

## 2014-09-18 DIAGNOSIS — O133 Gestational [pregnancy-induced] hypertension without significant proteinuria, third trimester: Secondary | ICD-10-CM

## 2014-09-18 DIAGNOSIS — O2412 Pre-existing diabetes mellitus, type 2, in childbirth: Secondary | ICD-10-CM

## 2014-09-18 DIAGNOSIS — O0933 Supervision of pregnancy with insufficient antenatal care, third trimester: Secondary | ICD-10-CM

## 2014-09-18 DIAGNOSIS — O364XX0 Maternal care for intrauterine death, not applicable or unspecified: Secondary | ICD-10-CM

## 2014-09-18 DIAGNOSIS — O34219 Maternal care for unspecified type scar from previous cesarean delivery: Secondary | ICD-10-CM | POA: Diagnosis not present

## 2014-09-18 DIAGNOSIS — Z3A37 37 weeks gestation of pregnancy: Secondary | ICD-10-CM

## 2014-09-18 LAB — CBC
HEMATOCRIT: 35 % — AB (ref 36.0–46.0)
Hemoglobin: 11.9 g/dL — ABNORMAL LOW (ref 12.0–15.0)
MCH: 30.5 pg (ref 26.0–34.0)
MCHC: 34 g/dL (ref 30.0–36.0)
MCV: 89.7 fL (ref 78.0–100.0)
Platelets: 274 10*3/uL (ref 150–400)
RBC: 3.9 MIL/uL (ref 3.87–5.11)
RDW: 14 % (ref 11.5–15.5)
WBC: 20.5 10*3/uL — ABNORMAL HIGH (ref 4.0–10.5)

## 2014-09-18 LAB — GLUCOSE, CAPILLARY
GLUCOSE-CAPILLARY: 126 mg/dL — AB (ref 65–99)
GLUCOSE-CAPILLARY: 166 mg/dL — AB (ref 65–99)
GLUCOSE-CAPILLARY: 212 mg/dL — AB (ref 65–99)
Glucose-Capillary: 103 mg/dL — ABNORMAL HIGH (ref 65–99)

## 2014-09-18 MED ORDER — SENNOSIDES-DOCUSATE SODIUM 8.6-50 MG PO TABS
2.0000 | ORAL_TABLET | ORAL | Status: DC
  Administered 2014-09-19: 2 via ORAL
  Filled 2014-09-18: qty 2

## 2014-09-18 MED ORDER — ONDANSETRON HCL 4 MG/2ML IJ SOLN: 4.0000 mg | INTRAMUSCULAR | Status: DC | PRN

## 2014-09-18 MED ORDER — ACETAMINOPHEN 325 MG PO TABS: 650.0000 mg | ORAL_TABLET | ORAL | Status: DC | PRN

## 2014-09-18 MED ORDER — MEASLES, MUMPS & RUBELLA VAC ~~LOC~~ INJ
0.5000 mL | INJECTION | Freq: Once | SUBCUTANEOUS | Status: DC
  Filled 2014-09-18: qty 0.5

## 2014-09-18 MED ORDER — OXYCODONE-ACETAMINOPHEN 5-325 MG PO TABS: 1.0000 | ORAL_TABLET | ORAL | Status: DC | PRN

## 2014-09-18 MED ORDER — MISOPROSTOL 200 MCG PO TABS
ORAL_TABLET | ORAL | Status: AC
  Filled 2014-09-18: qty 5

## 2014-09-18 MED ORDER — ZOLPIDEM TARTRATE 5 MG PO TABS: 5.0000 mg | ORAL_TABLET | Freq: Every evening | ORAL | Status: DC | PRN

## 2014-09-18 MED ORDER — DIPHENHYDRAMINE HCL 25 MG PO CAPS: 25.0000 mg | ORAL_CAPSULE | Freq: Four times a day (QID) | ORAL | Status: DC | PRN

## 2014-09-18 MED ORDER — FERROUS SULFATE 325 (65 FE) MG PO TABS
325.0000 mg | ORAL_TABLET | Freq: Two times a day (BID) | ORAL | Status: DC
  Administered 2014-09-18 – 2014-09-19 (×2): 325 mg via ORAL
  Filled 2014-09-18 (×2): qty 1

## 2014-09-18 MED ORDER — LORAZEPAM 1 MG PO TABS
2.0000 mg | ORAL_TABLET | Freq: Once | ORAL | Status: AC
  Administered 2014-09-18: 2 mg via ORAL
  Filled 2014-09-18: qty 2

## 2014-09-18 MED ORDER — LANOLIN HYDROUS EX OINT: 1.0000 "application " | TOPICAL_OINTMENT | CUTANEOUS | Status: DC | PRN

## 2014-09-18 MED ORDER — ONDANSETRON HCL 4 MG PO TABS: 4.0000 mg | ORAL_TABLET | ORAL | Status: DC | PRN

## 2014-09-18 MED ORDER — SIMETHICONE 80 MG PO CHEW: 80.0000 mg | CHEWABLE_TABLET | ORAL | Status: DC | PRN

## 2014-09-18 MED ORDER — IBUPROFEN 600 MG PO TABS
600.0000 mg | ORAL_TABLET | Freq: Four times a day (QID) | ORAL | Status: DC | PRN
  Administered 2014-09-18: 600 mg via ORAL
  Filled 2014-09-18: qty 1

## 2014-09-18 MED ORDER — DIBUCAINE 1 % RE OINT: 1.0000 "application " | TOPICAL_OINTMENT | RECTAL | Status: DC | PRN

## 2014-09-18 MED ORDER — BENZOCAINE-MENTHOL 20-0.5 % EX AERO
1.0000 | INHALATION_SPRAY | CUTANEOUS | Status: DC | PRN
Start: 2014-09-18 — End: 2014-09-19

## 2014-09-18 MED ORDER — MAGNESIUM HYDROXIDE 400 MG/5ML PO SUSP
30.0000 mL | ORAL | Status: DC | PRN
  Filled 2014-09-18: qty 30

## 2014-09-18 MED ORDER — OXYCODONE-ACETAMINOPHEN 5-325 MG PO TABS: 2.0000 | ORAL_TABLET | ORAL | Status: DC | PRN

## 2014-09-18 MED ORDER — MISOPROSTOL 200 MCG PO TABS: 1000.0000 ug | ORAL_TABLET | Freq: Once | ORAL | Status: DC

## 2014-09-18 MED ORDER — HYDRALAZINE HCL 20 MG/ML IJ SOLN
10.0000 mg | Freq: Once | INTRAMUSCULAR | Status: AC
  Administered 2014-09-18: 10 mg via INTRAMUSCULAR

## 2014-09-18 MED ORDER — WITCH HAZEL-GLYCERIN EX PADS: 1.0000 "application " | MEDICATED_PAD | CUTANEOUS | Status: DC | PRN

## 2014-09-18 MED ORDER — PRENATAL MULTIVITAMIN CH
1.0000 | ORAL_TABLET | Freq: Every day | ORAL | Status: DC
  Filled 2014-09-18: qty 1

## 2014-09-18 MED ORDER — IBUPROFEN 600 MG PO TABS
600.0000 mg | ORAL_TABLET | Freq: Four times a day (QID) | ORAL | Status: DC
  Administered 2014-09-18 – 2014-09-19 (×4): 600 mg via ORAL
  Filled 2014-09-18 (×4): qty 1

## 2014-09-18 MED ORDER — HYDRALAZINE HCL 20 MG/ML IJ SOLN
10.0000 mg | Freq: Once | INTRAMUSCULAR | Status: AC
  Administered 2014-09-18: 10 mg via INTRAVENOUS

## 2014-09-18 MED ORDER — TETANUS-DIPHTH-ACELL PERTUSSIS 5-2.5-18.5 LF-MCG/0.5 IM SUSP
0.5000 mL | Freq: Once | INTRAMUSCULAR | Status: DC
  Filled 2014-09-18: qty 0.5

## 2014-09-18 NOTE — Progress Notes (Addendum)
Morgan Allen was awake and lying in bed when I arrived. She said she with the exception of being hot she was ok. She stated she wished they could take these things off my legs b/c they are hot. Morgan Allen said her SO wanted Morgan Allen baptized and wants to be here when it happens. He has gone home to "take care of the animals" according to Morgan Allen. Gave nurse my number to call when dad arrives in order to administer baptism. Morgan Allen said she was raised Catholic but SO is [place] and she wants to make him happy. She also told me she had eaten earlier to please him. Please call when SO arrives. [phone]. Marjory Lies Chaplain

## 2014-09-18 NOTE — Progress Notes (Signed)
Patient ID: Morgan Allen, female   DOB: [birth year], 34 y.o.   MRN: [id number] Morgan Allen is a 34 y.o. G2P1001 at [redacted]w[redacted]d.  Subjective: Comfortable w/ epidural.   Objective:   99.5 F (37.5 C)  99  --  18   111/46 mmHg     FHT:  NA UC:   Q 2-4 minutes, Moderate  Dilation: 7 Effacement (%): 100 Station: +1 Presentation: Vertex Exam by:: VSmith CNM  Labs: Results for orders placed or performed during the hospital encounter of 09/16/14 (from the past 24 hour(s))  Glucose, capillary     Status: Abnormal   Collection Time: 09/17/14  5:59 AM  Result Value Ref Range   Glucose-Capillary 119 (H) 65 - 99 mg/dL  Protime-INR     Status: None   Collection Time: 09/17/14  9:21 AM  Result Value Ref Range   Prothrombin Time 14.3 11.6 - 15.2 seconds   INR 1.09 0.00 - 1.49  APTT     Status: None   Collection Time: 09/17/14  9:21 AM  Result Value Ref Range   aPTT 26 24 - 37 seconds  Fibrinogen     Status: Abnormal   Collection Time: 09/17/14  9:21 AM  Result Value Ref Range   Fibrinogen 498 (H) 204 - 475 mg/dL  Platelet count     Status: None   Collection Time: 09/17/14  9:21 AM  Result Value Ref Range   Platelets 253 150 - 400 K/uL  Save smear     Status: None   Collection Time: 09/17/14  9:21 AM  Result Value Ref Range   Smear Review SMEAR STAINED AND AVAILABLE FOR REVIEW   Glucose, capillary     Status: Abnormal   Collection Time: 09/17/14 10:09 AM  Result Value Ref Range   Glucose-Capillary 102 (H) 65 - 99 mg/dL  Glucose, capillary     Status: Abnormal   Collection Time: 09/17/14  2:02 PM  Result Value Ref Range   Glucose-Capillary 144 (H) 65 - 99 mg/dL  Glucose, capillary     Status: Abnormal   Collection Time: 09/17/14  6:15 PM  Result Value Ref Range   Glucose-Capillary 117 (H) 65 - 99 mg/dL  Glucose, capillary     Status: Abnormal   Collection Time: 09/17/14 10:01 PM   Assessment / Plan: [redacted]w[redacted]d week IUP Labor: Transition Fetal Wellbeing:  Category NA Pain Control:   Epidural Anticipated MOD:  VBAC Continue increasing pitocin  Morgan Allen, CNM 09/18/2014 4:30 AM

## 2014-09-18 NOTE — Progress Notes (Signed)
Patient ID: Morgan Allen, female   DOB: 1980-04-12, 34 y.o.   MRN: [id number] Morgan Allen is a 34 y.o. G2P1001 at [redacted]w[redacted]d.  Subjective: No urge to push.   Objective: BP 127/68 mmHg  Pulse 86  Temp(Src) 99.2 F (37.3 C) (Oral)  Resp 18  Ht 5\' 1"  (1.549 m)  Wt 320 lb (145.151 kg)  BMI 60.49 kg/m2  SpO2 95%  LMP 12/31/2013   FHT:  NA UC:   Q 2-3 minutes, strong  Dilation: 10 Dilation Complete Date: 09/18/14 Dilation Complete Time: 0619 Effacement (%): 100 Station: +1 Presentation: Vertex Exam by:: TWillis RNC  Labs: Result Value Ref Range   Glucose-Capillary 103 (H) 65 - 99 mg/dL    Assessment / Plan: [redacted]w[redacted]d week IUP Labor: Start second stage Fetal Wellbeing:  Category NA Pain Control:  Epidural Anticipated MOD:  SVD/VBAC  [place], CNM 09/18/2014 6:35 AM

## 2014-09-18 NOTE — Progress Notes (Signed)
Patient ID: Morgan Allen, female   DOB: Apr 13, 1980, 34 y.o.   MRN: [id number] Morgan Allen is a 34 y.o. G2P1001 at [redacted]w[redacted]d.  Subjective: Comfortable w/ epidural.   Objective: BP 123/67 mmHg  Pulse 89  Temp(Src) 99.2 F (37.3 C) (Oral)  Resp 18  Ht  (1.549 m)  Wt 320 lb (145.151 kg)  BMI 60.49 kg/m2  SpO2 95%  LMP 12/31/2013   FHT:  FHR: NA UC:   Q 3-5 minutes, strong  Dilation: 8.5 Effacement (%): 100 Station: +1 Presentation: Vertex Exam by:: VSmith CNM  Labs: Glucose, capillary     Status: Abnormal   Collection Time: 09/17/14 10:01 PM  Result Value Ref Range   Glucose-Capillary 138 (H) 65 - 99 mg/dL  Glucose, capillary     Status: Abnormal   Collection Time: 09/18/14  1:55 AM  Result Value Ref Range   Glucose-Capillary 126 (H) 65 - 99 mg/dL    Assessment / Plan: [redacted]w[redacted]d week IUP Labor: Transition Fetal Wellbeing:  Category NA Pain Control:  Epidural Anticipated MOD:  SVD/VBAC  [name], CNM 09/18/2014 4:35 AM

## 2014-09-18 NOTE — Progress Notes (Signed)
Morgan Allen, CNM  Notified of BP's decision made by CNM to hold IV labetalol protocol at this time and proceed with previously ordered PO dose

## 2014-09-18 NOTE — Progress Notes (Signed)
Morgan Allen notified of Maternal BP.  Order given to proceed with  IV labetalol

## 2014-09-19 ENCOUNTER — Other Ambulatory Visit: Payer: BLUE CROSS/BLUE SHIELD

## 2014-09-19 ENCOUNTER — Ambulatory Visit (HOSPITAL_COMMUNITY): Payer: BLUE CROSS/BLUE SHIELD

## 2014-09-19 LAB — GLUCOSE, CAPILLARY: GLUCOSE-CAPILLARY: 112 mg/dL — AB (ref 65–99)

## 2014-09-19 MED ORDER — IBUPROFEN 600 MG PO TABS: 600.0000 mg | ORAL_TABLET | Freq: Four times a day (QID) | ORAL | Status: DC | PRN

## 2014-09-19 MED ORDER — GLYBURIDE MICRONIZED 6 MG PO TABS: 6.0000 mg | ORAL_TABLET | Freq: Two times a day (BID) | ORAL | Status: DC

## 2014-09-19 NOTE — Discharge Summary (Cosign Needed)
Obstetric Discharge Summary Reason for Admission: induction of labor 2/2 IUFD  Prenatal Procedures: Preeclampsia and ultrasound Intrapartum Procedures: VBAC and vacuum 2/2 shoulder dystocia  Postpartum Procedures: none Complications-Operative and Postpartum: none  At 8:40 AM a non-viable female was delivered via VBAC, Spontaneous. Presentation: vertex; Position: Right,, Occiput,, Anterior.   Delivery of the head: 09/18/2014 8:10 AM. CNM attempted delivery via downward traction of head, but was unsuccessful after 3 attempts. Dx shoulder dystocia.  First maneuver: 09/18/2014 8:15 AM, Suprapubic Pressure, McRoberts performed w/ CNM. CNM attempted delivery of posterior arm, but was unable to reach. Attending called. [name] came to [facility]. Attempted delivery of posterior arm. Unable. Dr. Debroah Loop called. Second maneuver: , Posterior arm delivered By [name] MD. Fundal pressure applied by CNM. Second arm delivered. Body delivered slowly w/ difficulty.  Third maneuver: 09/18/2014 8:38 AM,   APGAR: 0, 0; weight 8 lb 9 oz .  Placenta status: Intact, Spontaneous.  Cord: 3 vessels with the following complications: None. Cord pH: NA  Anesthesia: Epidural  Episiotomy: None Lacerations: None Suture Repair: NA Est. Blood Loss (mL): 150  Mom to postpartum. Baby to [place]. Family undecided about autopsy.   Hospital Course:  Principal Problem:   VBAC (vaginal birth after Cesarean) Active Problems:   IUFD (intrauterine fetal death)   [redacted] weeks gestation of pregnancy   Fetal demise, greater than 22 weeks, antepartum   Noncompliant pregnant patient in third trimester, antepartum   Morgan Allen is a 34 y.o. G2P2001 s/p VBAC.  Patient was admitted 7/22.  She has postpartum course that was uncomplicated including no problems with ambulating, PO intake, urination, pain, or bleeding. The pt feels ready to go home and  will be discharged with outpatient follow-up.   Today: No acute  events overnight.  Pt denies problems with ambulating, voiding or po intake.  She denies nausea or vomiting.  Pain is well controlled.  She has had flatus. She has not had bowel movement.  Lochia Small.  Plan for birth control is undecided.   Pt output and BP are reassuring for discharge.   Physical Exam:  General: alert, cooperative and obese Lochia: appropriate Uterine Fundus: firm Incision: n/a DVT Evaluation: Negative Homan's sign. No cords or calf tenderness.  H/H: Lab Results  Component Value Date/Time   HGB 11.9* 09/18/2014 09:50 AM   HGB 13.4 03/18/2014   HCT 35.0* 09/18/2014 09:50 AM   HCT 39 03/18/2014    Discharge Diagnoses: Term IUFD - delivered  Discharge Information: Date: 09/19/2014 Activity: pelvic rest Diet: routine  Medications: Ibuprofen, home DM medications  Condition: stable Instructions: refer to handout Discharge to: home      Medication List    ASK your doctor about these medications        acetaminophen 325 MG tablet  Commonly known as:  TYLENOL  Take 650 mg by mouth every 6 (six) hours as needed for mild pain or moderate pain.     glyBURIDE 5 MG tablet  Commonly known as:  DIABETA  Take 5 mg by mouth 2 (two) times daily with a meal.     labetalol 100 MG tablet  Commonly known as:  NORMODYNE  Take 1 tablet (100 mg total) by mouth 2 (two) times daily.     metFORMIN 500 MG tablet  Commonly known as:  GLUCOPHAGE  Take 500 mg by mouth 2 (two) times daily with a meal.     PRENATAL PLUS IRON 29-1 MG Tabs  Plan for mother-baby nurse to visit later this week for BP and CBG check.   Morgan Allen  09/19/2014,9:07 AM

## 2014-09-19 NOTE — Discharge Instructions (Signed)

## 2014-09-19 NOTE — Progress Notes (Addendum)
Morgan Allen SO was bedside when I arrived. She was sitting on the side of her bed with her back turned mostly to him. She was tearful during our visit. Only when prompted, she had very little to say during our visit. [name] said he decided to skip the baptism after taking with a preacher last night. He said she Secondary school teacher) is without sin and would go to heaven. He said he didn't know what he was thinking yesterday. I offered my support for his decision. He asked what would I do and I told him I wanted to do what would be most meaningful for him and [name] and either decision he makes would be right. He replied she ([name]) doesn't care.  After a little more discussion, he seemed to resolve not to baptize. I told him if he changes his mind to please let the nurse know and I would come back. Morgan Allen was still crying while sitting on the side of the bed and I offered care to her but she declined. I asked if she had any questions and she said she was good. She was very short and firm in giving her answers. Please page or call if additional support is needed. Chaplain Elmarie Shiley Holder   09/19/14 1000  Clinical Encounter Type  Visited With Patient and family together

## 2014-09-19 NOTE — Discharge Summary (Signed)
Obstetric Discharge Summary Reason for Admission: induction of labor 2/2 IUFD  Prenatal Procedures: Preeclampsia and ultrasound Intrapartum Procedures: VBAC and vacuum 2/2 shoulder dystocia  Postpartum Procedures: none Complications-Operative and Postpartum: none  At 8:40 AM a non-viable female was delivered via VBAC, Spontaneous. Presentation: vertex; Position: Right,, Occiput,, Anterior.   Delivery of the head: 09/18/2014 8:10 AM. CNM attempted delivery via downward traction of head, but was unsuccessful after 3 attempts. Dx shoulder dystocia.  First maneuver: 09/18/2014 8:15 AM, Suprapubic Pressure, McRoberts performed w/ CNM. CNM attempted delivery of posterior arm, but was unable to reach. Attending called. Morgan Allen came to [facility]. Attempted delivery of posterior arm. Unable. Morgan Allen called. Second maneuver: , Posterior arm delivered By Morgan Darter MD. Fundal pressure applied by CNM. Second arm delivered. Body delivered slowly w/ difficulty.  Third maneuver: 09/18/2014 8:38 AM,   APGAR: 0, 0; weight 8 lb 9 oz .  Placenta status: Intact, Spontaneous.  Cord: 3 vessels with the following complications: None. Cord pH: NA  Anesthesia: Epidural  Episiotomy: None Lacerations: None Suture Repair: NA Est. Blood Loss (mL): 150  Mom to postpartum. Baby to [place]. Family undecided about autopsy.   Hospital Course:  Principal Problem:  VBAC (vaginal birth after Cesarean) Active Problems:  IUFD (intrauterine fetal death)  [redacted] weeks gestation of pregnancy  Fetal demise, greater than 22 weeks, antepartum  Noncompliant pregnant patient in third trimester, antepartum   Morgan Allen is a 34 y.o. G2P2001 s/p VBAC. Patient was admitted 7/22. She has postpartum course that was uncomplicated including no problems with ambulating, PO intake, urination, pain, or bleeding. The pt feels ready to go home and will be discharged with outpatient follow-up.   Today: No acute  events overnight. Pt denies problems with ambulating, voiding or po intake. She denies nausea or vomiting. Pain is well controlled. She has had flatus. She has not had bowel movement. Lochia Small. Plan for birth control is undecided.  Pt output and BP are reassuring for discharge.   Physical Exam:  General: alert, cooperative and obese Lochia: appropriate Uterine Fundus: firm Incision: n/a DVT Evaluation: Negative Homan's sign. No cords or calf tenderness.  H/H:  Labs (Brief)    Lab Results  Component Value Date/Time   HGB 11.9* 09/18/2014 09:50 AM   HGB 13.4 03/18/2014   HCT 35.0* 09/18/2014 09:50 AM   HCT 39 03/18/2014      Discharge Diagnoses: Term IUFD - delivered  Discharge Information: Date: 09/19/2014 Activity: pelvic rest Diet: routine  Medications: Ibuprofen, home DM medications  Condition: stable Instructions: refer to handout Discharge to: home      Medication List    ASK your doctor about these medications       acetaminophen 325 MG tablet  Commonly known as: TYLENOL  Take 650 mg by mouth every 6 (six) hours as needed for mild pain or moderate pain.     glyBURIDE 5 MG tablet  Commonly known as: DIABETA  Take 5 mg by mouth 2 (two) times daily with a meal.     labetalol 100 MG tablet  Commonly known as: NORMODYNE  Take 1 tablet (100 mg total) by mouth 2 (two) times daily.     metFORMIN 500 MG tablet  Commonly known as: GLUCOPHAGE  Take 500 mg by mouth 2 (two) times daily with a meal.     PRENATAL PLUS IRON 29-1 MG Tabs       Plan for mother-baby nurse to visit later this week for BP and  CBG check.   Parks Ranger  09/19/2014,9:07 AM               Obstetric Discharge Summary    Hospital Course: Physical Exam:  BP 143/89 mmHg  Pulse 96  Temp(Src) 97.8 F (36.6 C) (Oral)  Resp 18  Ht 5\' 1"  (1.549 m)  Wt 139.073 kg (306 lb 9.6 oz)  BMI 57.96 kg/m2  SpO2 98%   LMP 01/04/2014 General: alert and cooperative Lochia: appropriate Uterine Fundus: firm Incision: no significant drainage DVT Evaluation: No evidence of DVT seen on physical exam.  H/H: Lab Results  Component Value Date/Time   HGB 11.9* 09/18/2014 09:50 AM   HGB 13.4 03/18/2014   HCT 35.0* 09/18/2014 09:50 AM   HCT 39 03/18/2014    Discharge Diagnoses: VBAC delivered IUFD  Discharge Information: Date: 09/19/2014 Activity: pelvic rest Diet: routine  Medications: Glyburide 6 mg BID with meals and Labetolol 100mg  BID  Breast feeding:  No: IUFD Condition: stable Instructions: Pt will need to make her own appointment to follow up in [place] with a OBGYN for follow up from surgery, Diabetes, high blood pressure; Pt states she wants to do this and will schedule her own appointment and refer to handout Discharge to: home      Medication List    STOP taking these medications        acetaminophen 325 MG tablet  Commonly known as:  TYLENOL     glyBURIDE 5 MG tablet  Commonly known as:  DIABETA     metFORMIN 500 MG tablet  Commonly known as:  GLUCOPHAGE     PRENATAL PLUS IRON 29-1 MG Tabs      TAKE these medications        glyBURIDE micronized 6 MG tablet  Commonly known as:  GLYNASE  Take 1 tablet (6 mg total) by mouth 2 (two) times daily before a meal.     ibuprofen 600 MG tablet  Commonly known as:  ADVIL,MOTRIN  Take 1 tablet (600 mg total) by mouth every 6 (six) hours as needed for moderate pain.     labetalol 100 MG tablet  Commonly known as:  NORMODYNE  Take 1 tablet (100 mg total) by mouth 2 (two) times daily.           Follow-up Information    Follow up with [facility]. Schedule an appointment as soon as possible for a visit in 6 weeks.   Specialty:  Obstetrics and Gynecology   Why:  make an appointment to follow up with [facility] doctor per pt request   Contact information:   [address] [place] [address] [phone]        Leeroy Cha, CNM 09/19/2014,12:56 PM

## 2014-09-19 NOTE — Anesthesia Postprocedure Evaluation (Signed)
  Anesthesia Post-op Note  Patient: Morgan Allen  Procedure(s) Performed: Procedure(s) with comments: CESAREAN SECTION (N/A) - Tracie S. RNFA confirmed  Patient Location: ICU  Anesthesia Type:Epidural  Level of Consciousness: awake  Airway and Oxygen Therapy: Patient Spontanous Breathing  Post-op Pain: mild  Post-op Assessment: Patient's Cardiovascular Status Stable and Respiratory Function Stable              Post-op Vital Signs: stable  Last Vitals:  Filed Vitals:   09/19/14 0600  BP: 116/67  Pulse: 95  Temp: 36.4 C  Resp: 18    Complications: No apparent anesthesia complications

## 2014-09-20 ENCOUNTER — Telehealth: Payer: Self-pay | Admitting: *Deleted

## 2014-09-20 NOTE — Telephone Encounter (Signed)
Spoke with patient via phone.  Patient had called earlier today and spoke with Antoinette about her FMLA/Disability.  Patient was upset and stated she had not gotten a check.  I phoned the short term disability company - [employer].  Spoke with Till.  Information confirmed.  Requested clinicals be faxed to [phone].  Thomasene Mohair, registrar faxed records as requested.  Release of Information was previously obtained when first paperwork was completed.  Copy in registration under documents.  Phoned patient and spoke with her to let her know that I had spoken with the disability company and that we have faxed the necessary records they have requested.  I explained to patient we were able to do this because we had her release of information from when we initially completed her FMLA/Disability paperwork.  Patient is very Adult nurse.  States she will call the company and will let us know if she needs anything else.

## 2014-09-21 ENCOUNTER — Other Ambulatory Visit (HOSPITAL_COMMUNITY): Payer: BLUE CROSS/BLUE SHIELD

## 2014-09-21 ENCOUNTER — Ambulatory Visit (HOSPITAL_COMMUNITY): Payer: BLUE CROSS/BLUE SHIELD

## 2014-09-23 ENCOUNTER — Inpatient Hospital Stay (HOSPITAL_COMMUNITY)
Admission: AD | Admit: 2014-09-23 | Discharge: 2014-09-26 | DRG: 776 | Disposition: A | Discharge status: Home / Self Care | Payer: BLUE CROSS/BLUE SHIELD | Source: Ambulatory Visit | Attending: Family Medicine | Admitting: Family Medicine

## 2014-09-23 ENCOUNTER — Encounter (HOSPITAL_COMMUNITY): Payer: Self-pay | Admitting: Family Medicine

## 2014-09-23 DIAGNOSIS — Z87891 Personal history of nicotine dependence: Secondary | ICD-10-CM

## 2014-09-23 DIAGNOSIS — E119 Type 2 diabetes mellitus without complications: Secondary | ICD-10-CM

## 2014-09-23 DIAGNOSIS — J810 Acute pulmonary edema: Secondary | ICD-10-CM | POA: Diagnosis present

## 2014-09-23 DIAGNOSIS — O9089 Other complications of the puerperium, not elsewhere classified: Principal | ICD-10-CM | POA: Diagnosis present

## 2014-09-23 DIAGNOSIS — O152 Eclampsia in the puerperium: Secondary | ICD-10-CM

## 2014-09-23 DIAGNOSIS — O1415 Severe pre-eclampsia, complicating the puerperium: Secondary | ICD-10-CM | POA: Diagnosis present

## 2014-09-23 DIAGNOSIS — O9953 Diseases of the respiratory system complicating the puerperium: Secondary | ICD-10-CM | POA: Diagnosis present

## 2014-09-23 DIAGNOSIS — R0602 Shortness of breath: Secondary | ICD-10-CM

## 2014-09-23 DIAGNOSIS — I158 Other secondary hypertension: Secondary | ICD-10-CM | POA: Diagnosis present

## 2014-09-23 DIAGNOSIS — O1410 Severe pre-eclampsia, unspecified trimester: Secondary | ICD-10-CM | POA: Diagnosis not present

## 2014-09-23 DIAGNOSIS — O2413 Pre-existing diabetes mellitus, type 2, in the puerperium: Secondary | ICD-10-CM | POA: Diagnosis present

## 2014-09-23 DIAGNOSIS — E876 Hypokalemia: Secondary | ICD-10-CM | POA: Diagnosis present

## 2014-09-23 LAB — MRSA PCR SCREENING: MRSA by PCR: NEGATIVE

## 2014-09-23 LAB — GLUCOSE, CAPILLARY: GLUCOSE-CAPILLARY: 123 mg/dL — AB (ref 65–99)

## 2014-09-23 MED ORDER — LACTATED RINGERS IV SOLN
INTRAVENOUS | Status: DC
  Administered 2014-09-23 – 2014-09-24 (×3): via INTRAVENOUS

## 2014-09-23 MED ORDER — ACETAMINOPHEN 325 MG PO TABS
650.0000 mg | ORAL_TABLET | Freq: Four times a day (QID) | ORAL | Status: DC | PRN
  Administered 2014-09-24: 650 mg via ORAL
  Filled 2014-09-23: qty 2

## 2014-09-23 MED ORDER — HYDRALAZINE HCL 20 MG/ML IJ SOLN: 10.0000 mg | Freq: Once | INTRAMUSCULAR | Status: AC | PRN

## 2014-09-23 MED ORDER — SENNOSIDES-DOCUSATE SODIUM 8.6-50 MG PO TABS: 1.0000 | ORAL_TABLET | Freq: Every evening | ORAL | Status: DC | PRN

## 2014-09-23 MED ORDER — IBUPROFEN 600 MG PO TABS: 600.0000 mg | ORAL_TABLET | Freq: Four times a day (QID) | ORAL | Status: DC | PRN

## 2014-09-23 MED ORDER — ALUM & MAG HYDROXIDE-SIMETH 200-200-20 MG/5ML PO SUSP
30.0000 mL | Freq: Four times a day (QID) | ORAL | Status: DC | PRN
  Filled 2014-09-23: qty 30

## 2014-09-23 MED ORDER — MAGNESIUM SULFATE 50 % IJ SOLN
2.0000 g/h | INTRAMUSCULAR | Status: AC
  Administered 2014-09-23: 2 g/h via INTRAVENOUS
  Filled 2014-09-23: qty 80

## 2014-09-23 MED ORDER — FUROSEMIDE 10 MG/ML IJ SOLN
40.0000 mg | Freq: Two times a day (BID) | INTRAMUSCULAR | Status: AC
  Administered 2014-09-23 – 2014-09-24 (×3): 40 mg via INTRAVENOUS
  Filled 2014-09-23 (×3): qty 4

## 2014-09-23 MED ORDER — LABETALOL HCL 200 MG PO TABS
400.0000 mg | ORAL_TABLET | Freq: Two times a day (BID) | ORAL | Status: DC
  Administered 2014-09-23 – 2014-09-26 (×6): 400 mg via ORAL
  Filled 2014-09-23 (×6): qty 2

## 2014-09-23 MED ORDER — ACETAMINOPHEN 650 MG RE SUPP: 650.0000 mg | Freq: Four times a day (QID) | RECTAL | Status: DC | PRN

## 2014-09-23 MED ORDER — GLYBURIDE MICRONIZED 3 MG PO TABS
6.0000 mg | ORAL_TABLET | Freq: Two times a day (BID) | ORAL | Status: DC
  Administered 2014-09-24 – 2014-09-26 (×5): 6 mg via ORAL
  Filled 2014-09-23 (×5): qty 2

## 2014-09-23 MED ORDER — LABETALOL HCL 5 MG/ML IV SOLN
20.0000 mg | INTRAVENOUS | Status: DC | PRN
  Filled 2014-09-23: qty 4

## 2014-09-23 NOTE — Progress Notes (Signed)
   09/23/14 2204  Vitals  BP (!) 172/103 mmHg  MAP (mmHg) 120  Pulse Rate 95  Oxygen Therapy  SpO2 97 %  O2 Device Nasal Cannula  O2 Flow Rate (L/min) 2 L/min    Per MD, administer 40mg  lasix IV and start magnesium at 2g/hr before treating with labetalol

## 2014-09-23 NOTE — H&P (Signed)
Faculty Practice OB/Gyn History and Physical  Morgan Allen YIR:[id number] DOB: 28-Apr-1980 DOA: 09/23/2014  Chief Complaint: Shortness of breath  HPI: Morgan Allen is a 34 y.o. female G2P2001 who is 5 days postpartum after delivering stillborn fetus via vaginal delivery.  She remained hospitalized until the 25th, where she was discharged home. Upon discharge, the patient noted that she began having a dry, nonproductive cough with shortness of breath. The shortness of breath became worse with ambulation and improved at rest. Her breathing became more labored over the past several days. She presented to [facility] for evaluation. She received Lasix there, which is helped with her swelling and has improved her breathing a little. The patient also obtained broad-spectrum antibiotics (vancomycin and Levaquin) for concerns of hospital-acquired pneumonia. Due to concerns of the patient's illness, the patient was transferred to our hospital for further evaluation as the emergency room doctor was concerned of amniotic fluid emboli. Although this would be extremely unlikely given the patient's delivery was 5 days ago and that presentation usually occurs with extreme hypotension, we accepted the patient to maintain continuity of care.   Review of Systems:   Pt complains of normal lochia, swelling in her legs and hands.  Pt denies any fevers, chills, nausea, vomiting, diarrhea, constipation, chest pain, palpitations, pleurisy.  Review of systems are otherwise negative  Prenatal History/Complications: This is a G2P2001. Her last pregnancy was, gated with late prenatal care, type 2 diabetes, hypertension, preeclampsia, stillbirth, previous C-section  Past Medical History: Past Medical History  Diagnosis Date  . Pregnant   . Gestational diabetes   . Pregnancy induced hypertension   . Obesity     Past Surgical History: Past Surgical History  Procedure Laterality Date  . Cesarean section       Obstetrical History: OB History    Gravida Para Term Preterm AB TAB SAB Ectopic Multiple Living   2 2 2       0 1       Social History: History   Social History  . Marital Status: Single    Spouse Name: N/A  . Number of Children: N/A  . Years of Education: N/A   Social History Main Topics  . Smoking status: Former Games developer  . Smokeless tobacco: Not on file  . Alcohol Use: No  . Drug Use: No  . Sexual Activity: Yes   Other Topics Concern  . Not on file   Social History Narrative    Family History: Family History  Problem Relation Age of Onset  . Diabetes Father   . Hypertension Father   . Stroke Father   . Cancer Paternal Grandmother     Allergies: No Known Allergies  Prescriptions prior to admission  Medication Sig Dispense Refill Last Dose  . glyBURIDE micronized (GLYNASE) 6 MG tablet Take 1 tablet (6 mg total) by mouth 2 (two) times daily before a meal. 60 tablet 1   . ibuprofen (ADVIL,MOTRIN) 600 MG tablet Take 1 tablet (600 mg total) by mouth every 6 (six) hours as needed for moderate pain. 30 tablet 0   . labetalol (NORMODYNE) 100 MG tablet Take 1 tablet (100 mg total) by mouth 2 (two) times daily. 60 tablet 1 09/15/2014 at 2330    Physical Exam: BP 178/98 mmHg  Pulse 97  Temp(Src) 99 F (37.2 C) (Oral)  Resp 21  SpO2 95%  LMP 12/31/2013  General: Young Caucasian female. Awake and alert and oriented x3. No acute cardiopulmonary distress.  Eyes: Pupils equal,  round, reactive to light. Extraocular muscles are intact. Sclerae anicteric and noninjected.  ENT:  Moist mucosal membranes. No mucosal lesions. Teeth in good repair  Neck: Neck supple without lymphadenopathy. No carotid bruits. No masses palpated.  Cardiovascular: Regular rate with normal S1-S2 sounds. No murmurs, rubs, gallops auscultated. No JVD.  Respiratory: Good respiratory effort. Rales in the bases bilaterally to the mid lung fields. No rales in the upper lung fields. No wheezing, no  rhonchi Abdomen: Obese. Soft, nontender, nondistended. Active bowel sounds. No masses or hepatosplenomegaly  Skin: Dry, warm to touch. 2+ dorsalis pedis and radial pulses. 2+ nonpitting edema from mid shin distal Musculoskeletal: No calf or leg pain. All major joints not erythematous nontender.  Psychiatric: Intact judgment and insight.  Neurologic: No focal neurological deficits. Cranial nerves II through XII are grossly intact.             Labs on Admission:   Labs [facility]: Sodium 138, potassium 3.6, chloride 105, carbon dioxide 22, calcium 8.2, BUN 10, creatinine 0.65, serum glucose 130, AST 20, ALT 31, alkaline phosphatase 155, total bilirubin 0.32  BNP 170, CPK 109 lactic acid 0.9, troponin 0.01  CBC: White count 14.0, hemoglobin 13.2 hematocrit 38.1, platelets 436, segmented neutrophil percent 74, absolute neutrophil count 10.27  Urinalysis: Specific gravity 1.019, protein 2+, ketones 2+, bilirubin negative, urine nitrite negative, urine leukocytes negative, no bacteria  ABG: PH 7.497, PCO2 31.5, PO2 55.4, base excess 1.3  Chest x-ray: "Mild alveolar consolidation suggestive of multifocal pneumonia or possible allergic alveolitis"  CT angiogram of chest: "Patchy areas of airspace consolidation with minimal left pleural effusions just either some form of atypical pneumonia or other form of alveolitis including an allergic alveolitis."  EKG independently reviewed: Sinus tachycardia with a ventricular rate of 119. PR interval 0.136, QRS 0.068, QTC 0.452. No ST elevations or depressions. Negative for STEMI  Assessment/Plan Present on Admission:  . Hypertension in pregnancy, preeclampsia, severe, postpartum condition . Shortness of breath  #1 postpartum preeclampsia with severe features  Admit to ICU  Patient received magnesium 4 g bolus and 2 g for 1 hour at [facility]. Patient transferred here.  Continue to grams per hour of magnesium  sulfate  Strict I's and O's  Daily weights  CBC, metabolic panel in the morning #2 shortness of breath  Patient more consistent with fluid overload and pulmonary edema, although atypical pneumonia could certainly be an etiology  As mentioned above, I do not feel that the patient has an amniotic fluid embolism given her presentation of hypertension and her delivery 5 days ago. Again, amniotic fluid embolism usually occurs during delivery or within 30 minutes following and is associated with extreme hypotension or cardiac arrest, acute hypoxia, acute coagulopathy or hemorrhage. The patient's presentation is completely inconsistent with this etiology.  The patient's BNP is just slightly elevated, which would point to a fluid overload state  The patient does have a leukocytosis, which is not outside of the norm for postpartum patients.  We'll obtain a pro-calcitonin to evaluate for bacterial pneumonia, which I think is less likely as the patient is not having a productive cough, fever, pleurisy. Although calcitonin can be elevated and pregnant patients, they rapidly decreases to normal range in the postpartum period and is reliable for testing for bacterial infections. If the pro-calcitonin level is greater than 0.25, will obtain blood cultures, sputum cultures, and start antibiotics to treat for pneumonia.  As the patient did have improvement of her breathing with 1 dose  of Lasix, will repeat 40 mg of Lasix IV tonight and again tomorrow morning.  We'll check potassium to assure that she does not become hypokalemic #3 diabetes type 2  CBGs before meals and at bedtime  Continue glyburide #4 hypertension  We'll see how patient's blood pressure response to magnesium infusion  Continue labetalol 200 mg by mouth twice a day  Hypertensive protocol started  Code Status: Full code  Family Communication: None   Disposition Plan: Admit to ICU with magnesium for 24 hours   Levie Heritage,  DO 09/23/2014 10:17 PM Faculty Practice Attending Physician [facility] Attending Phone #: [phone]

## 2014-09-24 LAB — BASIC METABOLIC PANEL
Anion gap: 7 (ref 5–15)
BUN: 7 mg/dL (ref 6–20)
CALCIUM: 7.4 mg/dL — AB (ref 8.9–10.3)
CHLORIDE: 105 mmol/L (ref 101–111)
CO2: 27 mmol/L (ref 22–32)
CREATININE: 0.75 mg/dL (ref 0.44–1.00)
GFR calc Af Amer: >60 mL/min (ref >60)
GFR calc non Af Amer: >60 mL/min (ref >60)
Glucose, Bld: 151 mg/dL — ABNORMAL HIGH (ref 65–99)
Potassium: 3.2 mmol/L — ABNORMAL LOW (ref 3.5–5.1)
Sodium: 139 mmol/L (ref 135–145)

## 2014-09-24 LAB — CBC
HCT: 36 % (ref 36.0–46.0)
Hemoglobin: 12 g/dL (ref 12.0–15.0)
MCH: 29.9 pg (ref 26.0–34.0)
MCHC: 33.3 g/dL (ref 30.0–36.0)
MCV: 89.8 fL (ref 78.0–100.0)
Platelets: 372 10*3/uL (ref 150–400)
RBC: 4.01 MIL/uL (ref 3.87–5.11)
RDW: 13.7 % (ref 11.5–15.5)
WBC: 11.4 10*3/uL — ABNORMAL HIGH (ref 4.0–10.5)

## 2014-09-24 LAB — GLUCOSE, CAPILLARY
GLUCOSE-CAPILLARY: 130 mg/dL — AB (ref 65–99)
Glucose-Capillary: 117 mg/dL — ABNORMAL HIGH (ref 65–99)

## 2014-09-24 LAB — PROCALCITONIN

## 2014-09-24 MED ORDER — POTASSIUM CHLORIDE CRYS ER 20 MEQ PO TBCR
40.0000 meq | EXTENDED_RELEASE_TABLET | Freq: Two times a day (BID) | ORAL | Status: DC
  Administered 2014-09-24 – 2014-09-25 (×4): 40 meq via ORAL
  Filled 2014-09-24 (×5): qty 2

## 2014-09-24 NOTE — Progress Notes (Addendum)
FACULTY PRACTICE PROGRESS NOTE  Morgan Allen IHK:[id number] DOB: Dec 24, 1980 DOA: 09/23/2014 PCP: No primary care provider on file.  Assessment/Plan: #1 postpartum preeclampsia with severe features  Continue magnesium for 24 hours total - aed time approximately 10:00 tonight.  We'll observe blood pressures off magnesium and see if they rebound to severe range.  Blood pressures have been in the 130s to 140s systolically - Continue labetalol 400 mg by mouth  Labs stable #2 acute pulmonary edema  Pro calcitonin point against bacterial pneumonia  Diuresing well  Weight down 7 pounds in approximately -2.8 L since admission last night  Lasix this morning and tonight to continue diuresing #3 diabetes mellitus type 2  Continue glyburide  CBGs overnight and this morning stable # 4 Hypokalemia  On potassium for replacement  Check metabolic panel tomorrow morning to assure creatinine stability and potassium Code Status: Full code Disposition Plan: Likely discharge tomorrow morning   Consultants:  None  Procedures:  None  Antibiotics:  None   HPI/Subjective: Patient states that her breathing is much easier. She finds that she is not coughing quite as much and does not have coarse breath sounds. She hasn't been out of bed yet and so is unsure of whether her breathing worsens with exertion.  Objective: Filed Vitals:   09/24/14 0600  BP: 143/80  Pulse: 75  Temp:   Resp:     Intake/Output Summary (Last 24 hours) at 09/24/14 0719 Last data filed at 09/24/14 0655  Gross per 24 hour  Intake 1481.25 ml  Output   4350 ml  Net -2868.75 ml   Filed Weights   09/23/14 2132 09/24/14 0500  Weight: 293 lb 4.8 oz (133.04 kg) 286 lb 9.6 oz (130.001 kg)    Exam:   General:  Pleasant young female. Awake alert and oriented 3. No acute cardiopulmonary distress  Cardiovascular: Regular rate with normal S1 and S2 sounds. No murmurs auscultated.  Respiratory: Breath sounds  improved - mild rales in bases.  Abdomen: Soft, nontender, nondistended. Appropriate bowel sounds.  Musculoskeletal: Major joints nontender, not erythematous. No calf pain  Extremity: 1+ nonpitting edema in legs bilaterally. No calf tenderness. No evidence of DVT.  Data Reviewed: Basic Metabolic Panel:  Recent Labs Lab 09/24/14 0525  NA 139  K 3.2*  CL 105  CO2 27  GLUCOSE 151*  BUN 7  CREATININE 0.75  CALCIUM 7.4*   Liver Function Tests: No results for input(s): AST, ALT, ALKPHOS, BILITOT, PROT, ALBUMIN in the last 168 hours. No results for input(s): LIPASE, AMYLASE in the last 168 hours. No results for input(s): AMMONIA in the last 168 hours. CBC:  Recent Labs Lab 09/17/14 0921 09/18/14 0950 09/24/14 0525  WBC  --  20.5* 11.4*  HGB  --  11.9* 12.0  HCT  --  35.0* 36.0  MCV  --  89.7 89.8  PLT 253 274 372   Cardiac Enzymes: No results for input(s): CKTOTAL, CKMB, CKMBINDEX, TROPONINI in the last 168 hours. BNP (last 3 results) No results for input(s): BNP in the last 8760 hours.  ProBNP (last 3 results) No results for input(s): PROBNP in the last 8760 hours.  CBG:  Recent Labs Lab 09/18/14 1506 09/18/14 2253 09/19/14 0518 09/23/14 2245 09/24/14 0204  GLUCAP 166* 212* 112* 123* 117*    Recent Results (from the past 240 hour(s))  MRSA PCR Screening     Status: None   Collection Time: 09/23/14 10:40 PM  Result Value Ref Range Status   MRSA  by PCR NEGATIVE NEGATIVE Final    Comment:        The GeneXpert MRSA Assay (FDA approved for NASAL specimens only), is one component of a comprehensive MRSA colonization surveillance program. It is not intended to diagnose MRSA infection nor to guide or monitor treatment for MRSA infections.      Studies: No results found.  Scheduled Meds: . furosemide  40 mg Intravenous BID  . glyBURIDE micronized  6 mg Oral BID AC  . labetalol  400 mg Oral BID   Continuous Infusions: . lactated ringers 100 mL/hr  at 09/23/14 2216  . magnesium sulfate 2 g/hr (09/23/14 2216)    Principal Problem:   Hypertension in pregnancy, preeclampsia, severe, postpartum condition Active Problems:   Shortness of breath   Diabetes mellitus, type II    Time spent: 25 minutes    Brooke Pace  Faculty Practice Attending Physician [facility] [place] Phone: [phone] 09/24/2014, 7:19 AM  LOS: 1 day

## 2014-09-24 NOTE — Progress Notes (Signed)
UR chart review completed.  

## 2014-09-24 NOTE — Clinical Documentation Improvement (Signed)
Query #1 "Pulmonary Edema" is documented in the current medical record.  Patient has received IV Lasix this admission with reported improvement in the her shortness of breath and her weight is down 7 pounds in approximately -2.8 L since admission last night per progress note 09/23/13.  Please document the ACUITY of the patient's pulmonary edema in the progress notes and discharge summary:   - Acute  - Other Type  - Unable to clinically determine  Query #2  Please document a diagnosis/condition requiring treatment with po Potassium in the progress notes and discharge summary.  Clinical Information: Potassium 3.2 on admission. "On potassium for replacement" and "Check metabolic panel tomorrow morning to assure creatinine stability and potassium" is documented in the progress notes dated 09/23/13.  Patient is currently receiving K-Dur 40 mq bid.   Thank You, Jerral Ralph ,RN Clinical Documentation Specialist:  [id number] [facility] Health Information Management

## 2014-09-25 ENCOUNTER — Inpatient Hospital Stay (HOSPITAL_COMMUNITY): Payer: BLUE CROSS/BLUE SHIELD

## 2014-09-25 ENCOUNTER — Encounter (HOSPITAL_COMMUNITY): Payer: Self-pay | Admitting: *Deleted

## 2014-09-25 LAB — GLUCOSE, CAPILLARY: Glucose-Capillary: 114 mg/dL — ABNORMAL HIGH (ref 65–99)

## 2014-09-25 LAB — BASIC METABOLIC PANEL
Anion gap: 4 — ABNORMAL LOW (ref 5–15)
BUN: 11 mg/dL (ref 6–20)
CO2: 27 mmol/L (ref 22–32)
Calcium: 7.6 mg/dL — ABNORMAL LOW (ref 8.9–10.3)
Chloride: 106 mmol/L (ref 101–111)
Creatinine, Ser: 0.78 mg/dL (ref 0.44–1.00)
GFR calc Af Amer: >60 mL/min (ref >60)
GLUCOSE: 147 mg/dL — AB (ref 65–99)
Potassium: 3.7 mmol/L (ref 3.5–5.1)
Sodium: 137 mmol/L (ref 135–145)

## 2014-09-25 NOTE — Progress Notes (Signed)
Patient ID: Morgan Allen, female   DOB: 1980/12/30, 34 y.o.   MRN: [id number]   Subjective: Interval History: Feels better. States her breathing is improved. Moderate bleeding. Her swelling is down significantly.  Objective: Vital signs in last 24 hours: Temp:  [97.6 F (36.4 C)-98.5 F (36.9 C)] 97.6 F (36.4 C) (07/31 0646) Pulse Rate:  [67-94] 78 (07/31 0600) Resp:  [16-20] 18 (07/31 0000) BP: (121-151)/(67-95) 129/77 mmHg (07/31 0600) SpO2:  [92 %-98 %] 94 % (07/31 0600) Weight:  [283 lb (128.368 kg)] 283 lb (128.368 kg) (07/31 0646) Down 10 lbs since admission.  Intake/Output from previous day: 07/30 0701 - 07/31 0700 In: 2620 [P.O.:660; I.V.:1960] Out: 6205 [Urine:6205] Intake/Output this shift:    General appearance: alert, cooperative and appears stated age Head: Normocephalic, without obvious abnormality, atraumatic Neck: no JVD and supple, symmetrical, trachea midline Lungs: clear to auscultation bilaterally Heart: regular rate and rhythm, S1, S2 normal, no murmur, click, rub or gallop Abdomen: soft, non-tender; bowel sounds normal; no masses,  no organomegaly Extremities: extremities normal, atraumatic, no cyanosis or edema Neurologic: Grossly normal Psychiatric: seems sad, tearful  Results for orders placed or performed during the hospital encounter of 09/23/14 (from the past 24 hour(s))  Glucose, capillary     Status: Abnormal   Collection Time: 09/24/14  7:42 AM  Result Value Ref Range   Glucose-Capillary 130 (H) 65 - 99 mg/dL   Comment 1 Document in Chart     Scheduled Meds: . glyBURIDE micronized  6 mg Oral BID AC  . labetalol  400 mg Oral BID  . potassium chloride  40 mEq Oral BID   Continuous Infusions: . lactated ringers Stopped (09/24/14 2159)   PRN Meds:acetaminophen **OR** acetaminophen, alum & mag hydroxide-simeth, ibuprofen, labetalol, senna-docusate  Assessment/Plan: #1 postpartum preeclampsia with severe features  S/p Magnesium Sulfate x  24 hour.  Will observe blood pressures off magnesium and see if they rebound to severe range. Probably another 24 hours to watch her BP's.  Blood pressures have been in the 130s to 140s systolically - Continue labetalol 400 mg by mouth #2 acute pulmonary edema  Diuresing well  Weight down 10 pounds in approximately -9 L since admission last night  Repeat CXR this am #3 diabetes mellitus type 2  Continue glyburide  CBGs overnight and this morning stable # 4 Hypokalemia  On potassium for replacement  Check metabolic panel this morning to assure creatinine stability and potassium #5 postpartum depression  Offered counseling, patient declined  Offered anti-depressant, patient declined   LOS: 2 days   Jaynia Fendley S, MD 09/25/2014 7:40 AM

## 2014-09-26 DIAGNOSIS — O1410 Severe pre-eclampsia, unspecified trimester: Secondary | ICD-10-CM

## 2014-09-26 LAB — GLUCOSE, CAPILLARY: Glucose-Capillary: 89 mg/dL (ref 65–99)

## 2014-09-26 MED ORDER — LABETALOL HCL 200 MG PO TABS: 400.0000 mg | ORAL_TABLET | Freq: Two times a day (BID) | ORAL | Status: DC

## 2014-09-26 NOTE — Progress Notes (Signed)
Pt discharged to home with husband.  Condition stable.  Pt ambulated to car with J. Bass, NT.  No equipment for home ordered at discharge. 

## 2014-09-26 NOTE — Discharge Summary (Signed)
Physician Discharge Summary  Patient ID: [name] MRN: [id number] DOB/AGE: 05-Mar-1980 34 y.o.  Admit date: 09/23/2014 Discharge date: 09/26/2014  Admission Diagnoses:  Discharge Diagnoses:  Principal Problem:   Hypertension in pregnancy, preeclampsia, severe, postpartum condition Active Problems:   Acute pulmonary edema   Diabetes mellitus, type II   Discharged Condition: good HPI: Morgan Allen is a 34 y.o. female G2P2001 who is 5 days postpartum after delivering stillborn fetus via vaginal delivery. She remained hospitalized until the 25th, where she was discharged home. Upon discharge, the patient noted that she began having a dry, nonproductive cough with shortness of breath. The shortness of breath became worse with ambulation and improved at rest. Her breathing became more labored over the past several days. She presented to [facility] for evaluation. She received Lasix there, which is helped with her swelling and has improved her breathing a little. The patient also obtained broad-spectrum antibiotics (vancomycin and Levaquin) for concerns of hospital-acquired pneumonia. Due to concerns of the patient's illness, the patient was transferred to our hospital for further evaluation as the emergency room doctor was concerned of amniotic fluid emboli. Although this would be extremely unlikely given the patient's delivery was 5 days ago and that presentation usually occurs with extreme hypotension, we accepted the patient to maintain continuity of care. Hospital Course: Patient was kept in the ICU while diuresis was achieved and BP was controlled. She received lasix and labetalol. Her chest x-ray was normal 7/31 and she was released from ICU. Her BG control was good Consults: pulmonary/intensive care  Significant Diagnostic Studies: radiology: X-Ray: pulmonary edema  Treatments: lasix and labetalol  Discharge Exam: Blood pressure 146/90, pulse 87, temperature 98.2 F (36.8  C), temperature source Oral, resp. rate 17, height  (1.549 m), weight 126.667 kg (279 lb 4 oz), last menstrual period 12/31/2013, SpO2 97 %. General appearance: alert, cooperative and no distress Chest wall: normal effort Extremities: edema minimal CMP     Component Value Date/Time   NA 137 09/25/2014 0815   K 3.7 09/25/2014 0815   CL 106 09/25/2014 0815   CO2 27 09/25/2014 0815   GLUCOSE 147* 09/25/2014 0815   BUN 11 09/25/2014 0815   CREATININE 0.78 09/25/2014 0815   CREATININE 0.62 09/05/2014 1051   CALCIUM 7.6* 09/25/2014 0815   PROT 5.4* 09/05/2014 1051   ALBUMIN 2.7* 09/05/2014 1051   AST 19 09/16/2014 1255   ALT 19 09/16/2014 1255   ALKPHOS 91 09/05/2014 1051   BILITOT 0.3 09/05/2014 1051   GFRNONAA >60 09/25/2014 0815   GFRAA >60 09/25/2014 0815    CBG (last 3)   Recent Labs  09/24/14 0742 09/25/14 0742 09/26/14 0601  GLUCAP 130* 114* 89      Disposition: 01-Home or Self Care     Medication List    TAKE these medications        glyBURIDE micronized 6 MG tablet  Commonly known as:  GLYNASE  Take 1 tablet (6 mg total) by mouth 2 (two) times daily before a meal.     labetalol 200 MG tablet  Commonly known as:  NORMODYNE  Take 2 tablets (400 mg total) by mouth 2 (two) times daily.           Follow-up Information    Follow up with [facility] In 3 weeks.   Specialty:  Obstetrics and Gynecology   Contact information:   [address] [place] [address] [phone]  Signed: ARNOLD,JAMES 09/26/2014, 7:06 AM

## 2014-09-26 NOTE — Discharge Instructions (Signed)

## 2014-09-28 ENCOUNTER — Telehealth: Payer: Self-pay | Admitting: *Deleted

## 2014-09-28 NOTE — Telephone Encounter (Addendum)
Called pt and LM that we are returning her call to please give [place] a call back. Re:  Please inform pt that the labetalol @ [facility] is $18.35 without insurance.  Pt has BCBS so it may be cheaper but no more than that amount.    8/10  1110  Called pt on mobile # and heard message from a female. I left a message that I have important information for her. Please call back and leave a message on nurse voice mail of when we can reach her and the best phone number to call.                    Pt's home # has been disconnected.  Diane Day RNC

## 2014-09-28 NOTE — Telephone Encounter (Signed)
Pt called to inform us that the labetalol costs her $47 can we call in an alternate rx.

## 2014-10-05 NOTE — Progress Notes (Signed)
Post discharge chart review completed.  

## 2014-10-06 ENCOUNTER — Encounter: Payer: Self-pay | Admitting: *Deleted

## 2014-10-06 NOTE — Telephone Encounter (Signed)
Attempted to contact patient, no answer, left message on mobile phone for patient to return call to the clinic.  Will send letter.

## 2014-10-17 ENCOUNTER — Ambulatory Visit: Payer: BLUE CROSS/BLUE SHIELD | Admitting: Obstetrics & Gynecology

## 2014-10-21 ENCOUNTER — Emergency Department (HOSPITAL_COMMUNITY)
Admission: EM | Admit: 2014-10-21 | Discharge: 2014-10-21 | Disposition: A | Discharge status: Home / Self Care | Payer: BLUE CROSS/BLUE SHIELD | Attending: Emergency Medicine | Admitting: Emergency Medicine

## 2014-10-21 ENCOUNTER — Encounter (HOSPITAL_COMMUNITY): Payer: Self-pay | Admitting: Emergency Medicine

## 2014-10-21 DIAGNOSIS — Z87891 Personal history of nicotine dependence: Secondary | ICD-10-CM | POA: Insufficient documentation

## 2014-10-21 DIAGNOSIS — F329 Major depressive disorder, single episode, unspecified: Secondary | ICD-10-CM | POA: Diagnosis not present

## 2014-10-21 DIAGNOSIS — N939 Abnormal uterine and vaginal bleeding, unspecified: Secondary | ICD-10-CM | POA: Insufficient documentation

## 2014-10-21 DIAGNOSIS — Z8632 Personal history of gestational diabetes: Secondary | ICD-10-CM | POA: Diagnosis not present

## 2014-10-21 DIAGNOSIS — E669 Obesity, unspecified: Secondary | ICD-10-CM | POA: Insufficient documentation

## 2014-10-21 LAB — CBC WITH DIFFERENTIAL/PLATELET
BASOS ABS: 0 10*3/uL (ref 0.0–0.1)
Basophils Relative: 0 % (ref 0–1)
Eosinophils Absolute: 0.3 10*3/uL (ref 0.0–0.7)
Eosinophils Relative: 2 % (ref 0–5)
HCT: 39.7 % (ref 36.0–46.0)
HEMOGLOBIN: 13.3 g/dL (ref 12.0–15.0)
Lymphocytes Relative: 25 % (ref 12–46)
Lymphs Abs: 2.8 10*3/uL (ref 0.7–4.0)
MCH: 29.7 pg (ref 26.0–34.0)
MCHC: 33.5 g/dL (ref 30.0–36.0)
MCV: 88.6 fL (ref 78.0–100.0)
Monocytes Absolute: 0.6 10*3/uL (ref 0.1–1.0)
Monocytes Relative: 6 % (ref 3–12)
NEUTROS ABS: 7.4 10*3/uL (ref 1.7–7.7)
NEUTROS PCT: 67 % (ref 43–77)
PLATELETS: 273 10*3/uL (ref 150–400)
RBC: 4.48 MIL/uL (ref 3.87–5.11)
RDW: 13.5 % (ref 11.5–15.5)
WBC: 11.1 10*3/uL — ABNORMAL HIGH (ref 4.0–10.5)

## 2014-10-21 NOTE — ED Provider Notes (Signed)
CSN: [id number]     Arrival date & time 10/21/14  1953 History  This chart was scribed for Laurence Spates, MD by Placido Sou, ED scribe. This patient was seen in room APA04/APA04 and the patient's care was started at 10:35 PM.   Chief Complaint  Patient presents with  . Vaginal Bleeding   The history is provided by the patient. No language interpreter was used.    HPI Comments: Morgan Allen is a 34 y.o. female who presents to the Emergency Department complaining of intermittent, mild, vaginal bleeding that began 1 month ago, stopped 1 week ago, and began once again 4 days ago. Pt notes giving birth 1 month ago to a stillborn child with her symptoms beginning thereafter and further notes that this was her first vaginal delivery. No complications with hemorrhage during or immediately after delivery. Pt notes associated, constant, moderate, crampy left sided abd pain that radiates to her back . It feels like really bad period cramps. She notes having had an appointment with her OB/GYN on 8/22 that she didn't make it to. Pt denies any other medical issues or currently being on any medications regularly. She denies any lightheadedness, dysuria, fever, chills or n/v/d.   Past Medical History  Diagnosis Date  . Pregnant   . Gestational diabetes   . Pregnancy induced hypertension   . Obesity    Past Surgical History  Procedure Laterality Date  . Cesarean section     Family History  Problem Relation Age of Onset  . Diabetes Father   . Hypertension Father   . Stroke Father   . Cancer Paternal Grandmother    Social History  Substance Use Topics  . Smoking status: Former Games developer  . Smokeless tobacco: None  . Alcohol Use: No   OB History    Gravida Para Term Preterm AB TAB SAB Ectopic Multiple Living   2 2 2       0 1     Review of Systems A complete 10 system review of systems was obtained and all systems are negative except as noted in the HPI and PMH.   Allergies  Review of  patient's allergies indicates no known allergies.  Home Medications   Prior to Admission medications   Medication Sig Start Date End Date Taking? Authorizing Provider  glyBURIDE micronized (GLYNASE) 6 MG tablet Take 1 tablet (6 mg total) by mouth 2 (two) times daily before a meal. Patient not taking: Reported on 10/21/2014 09/19/14   [name] Fiscal A Clemmons, CNM  labetalol (NORMODYNE) 200 MG tablet Take 2 tablets (400 mg total) by mouth 2 (two) times daily. Patient not taking: Reported on 10/21/2014 09/26/14   Adam Phenix, MD   BP 139/84 mmHg  Pulse 104  Temp(Src) 98.2 F (36.8 C) (Oral)  Resp 18  Ht 5\' 1"  (1.549 m)  Wt 275 lb (124.739 kg)  BMI 51.99 kg/m2  SpO2 100%  LMP 09/18/2014 Physical Exam  Constitutional: She is oriented to person, place, and time. She appears well-developed and well-nourished. No distress.  HENT:  Head: Normocephalic and atraumatic.  Mouth/Throat: No oropharyngeal exudate.  Eyes: Right eye exhibits no discharge. Left eye exhibits no discharge.  Neck: Normal range of motion. No tracheal deviation present.  Cardiovascular: Normal rate, regular rhythm and normal heart sounds.   Pulmonary/Chest: Effort normal and breath sounds normal. No respiratory distress.  Abdominal: Soft. Bowel sounds are normal. She exhibits no distension. There is tenderness. There is no rebound and no guarding.  Mild suprapubic tenderness without rebound or guarding  Genitourinary:  Moderate amt of bright red blood in vaginal vault and at cervical os; exam limited due to pt's body habitus; no cervical motion or adnexal tenderness  Musculoskeletal: Normal range of motion.  Neurological: She is alert and oriented to person, place, and time.  Skin: Skin is warm and dry. She is not diaphoretic.  Psychiatric:  Flat affect; depressed mood  Nursing note and vitals reviewed. Chaperone was present during exam.  ED Course  Procedures  DIAGNOSTIC STUDIES: Oxygen Saturation is 100% on RA, normal  by my interpretation.    COORDINATION OF CARE: 10:44 PM Discussed treatment plan with pt at bedside and pt agreed to plan.  Labs Review Labs Reviewed  CBC WITH DIFFERENTIAL/PLATELET - Abnormal; Notable for the following:    WBC 11.1 (*)    All other components within normal limits    Imaging Review No results found. I have personally reviewed and evaluated these lab results as part of my medical decision-making.   EKG Interpretation None      MDM   Final diagnoses:  Vaginal bleeding   34 year old female who presents with vaginal bleeding in the setting of vaginal delivery of child one month ago. Patient awake, alert, depressed but in no acute distress at presentation. Vital signs unremarkable. Moderate amount of bright red blood on pelvic exam but no other abnormalities noted. No cervical motion or adnexal tenderness to suggest uterine infection or ovarian process. I spoke with the OB/GYN on call, Dr. Emelda Fear, regarding the patient's symptoms. He agreed that the patient likely is starting her first menstrual cycle postpartum and does not require any hormone therapy at this time. Per his recommendation, I instructed the patient to contact the [facility] on Monday morning as Dr. Emelda Fear is willing to see the patient on Monday afternoon in the clinic. I reviewed return precautions including lightheadedness, severe bleeding, or fever. Patient voiced understanding. Prior to the printing out the discharge paperwork, the patient left stating that she did not want the discharge papers.  I personally performed the services described in this documentation, which was scribed in my presence. The recorded information has been reviewed and is accurate.   Laurence Spates, MD 10/22/14 [phone]

## 2014-10-21 NOTE — ED Notes (Signed)
Patient complaining of heavy vaginal bleeding and abdominal cramping since birth of child over one month ago. States she has not followed up with PCP. States she had appointment on August 22 but did not show.

## 2014-10-21 NOTE — ED Notes (Signed)
Gave patient menstrual pads as requested. Patient came from room and stated she was leaving and did not want to wait for her discharge papers. Patient left without signing discharge or receiving discharge papers.

## 2016-06-30 IMAGING — US US OB LIMITED
1 series · 5 of 5 positions shown · non-contrast
Comparison: none

[Series 1: us ob limited · 5 acquisitions, 5 frames shown]
[im 1/5]
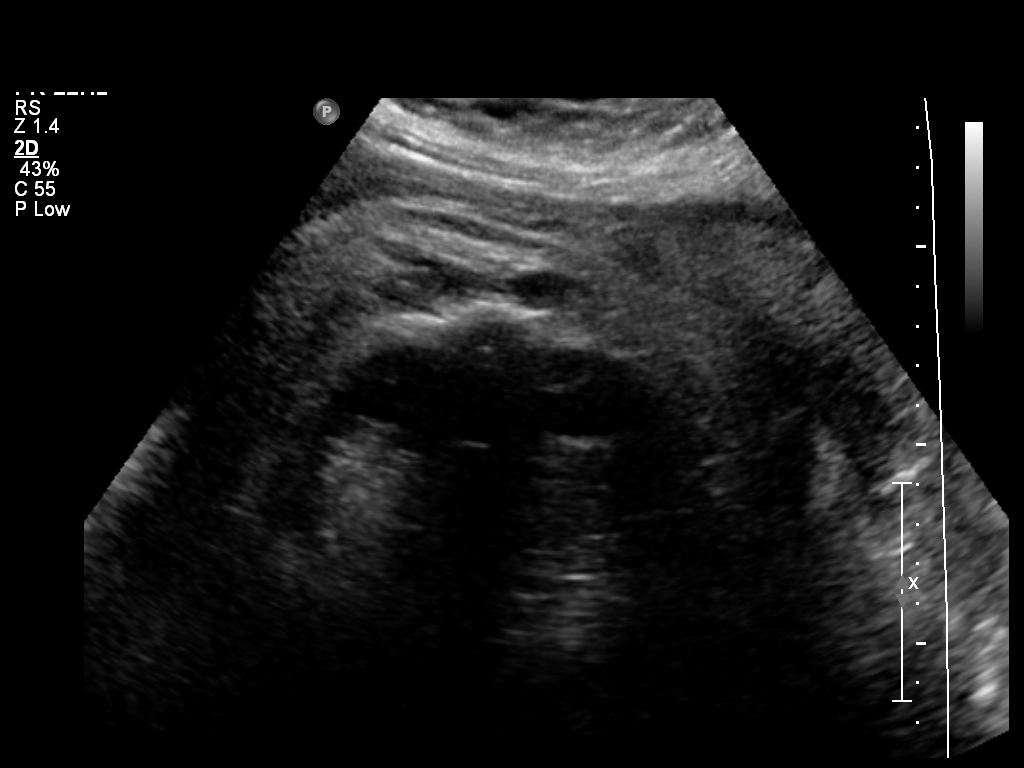
[im 2/5]
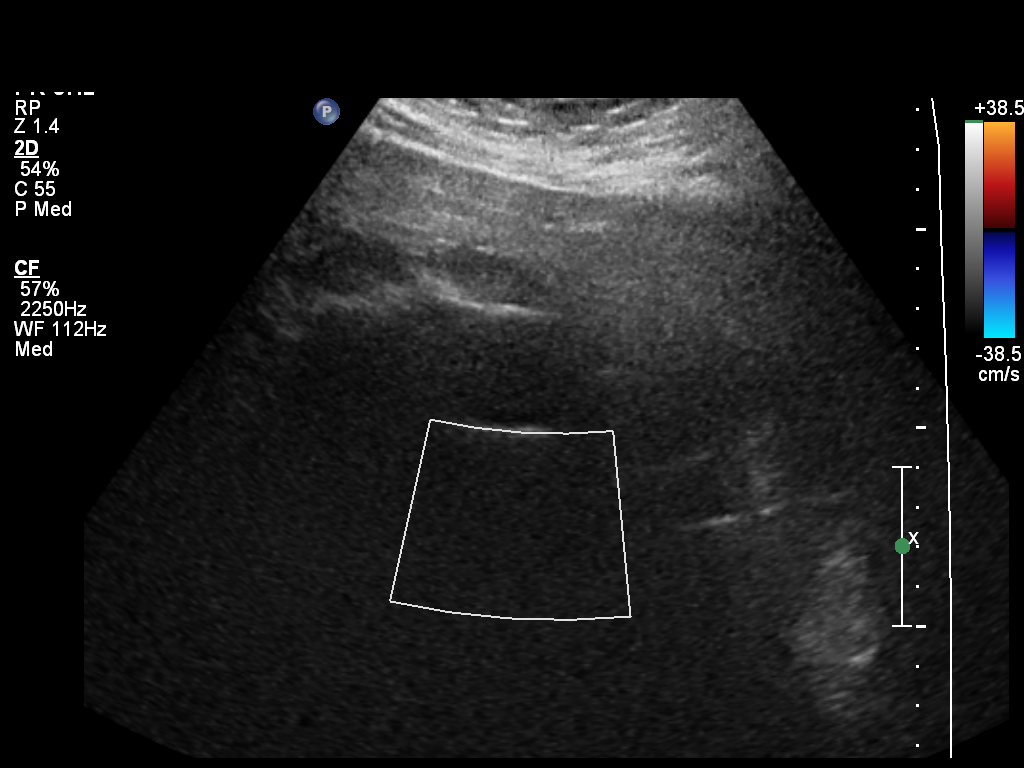
[im 3/5]
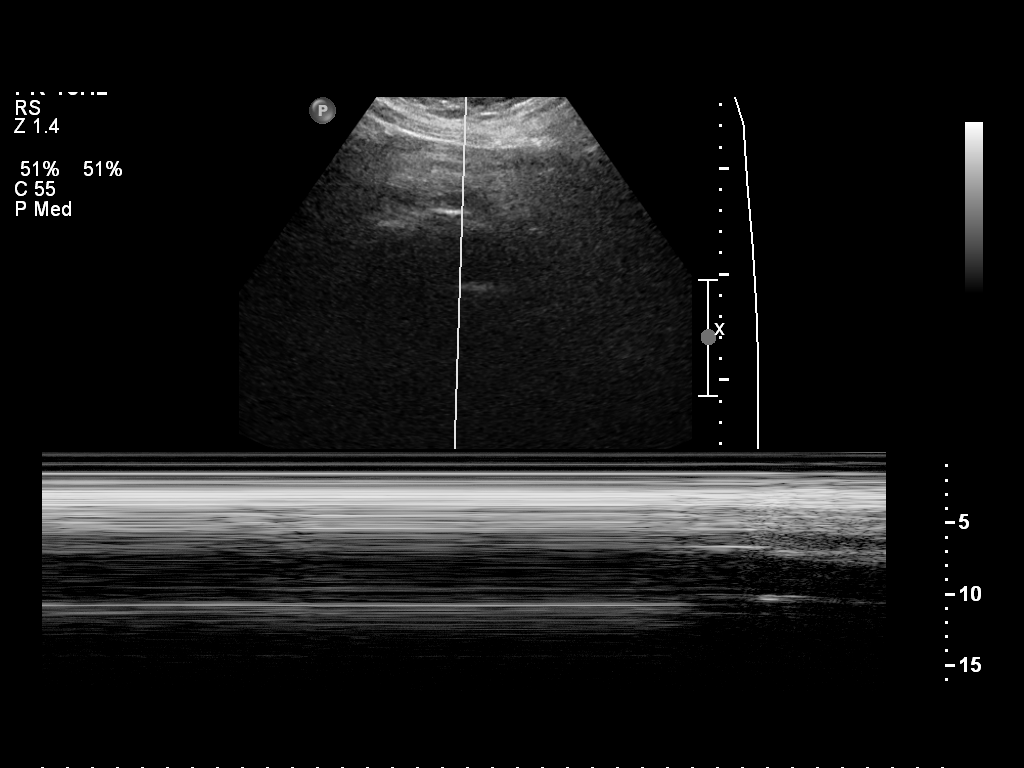
[im 4/5]
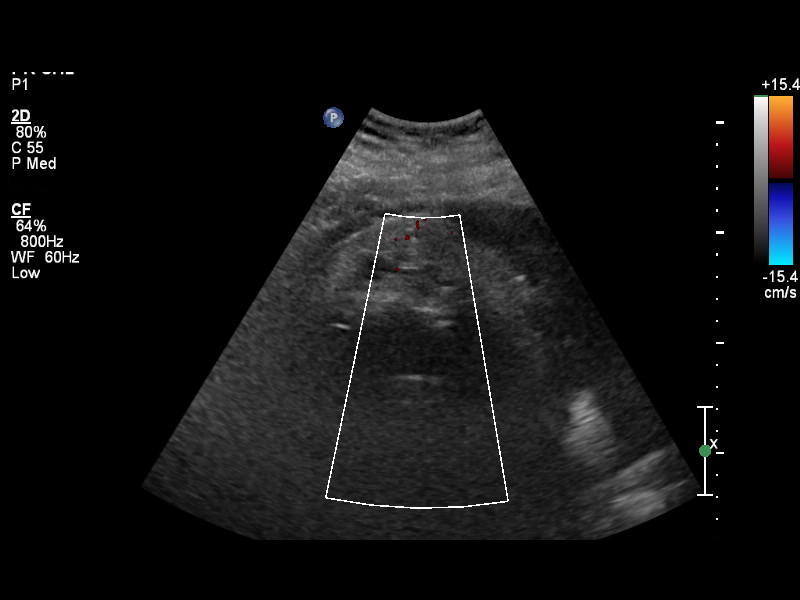
[im 5/5]
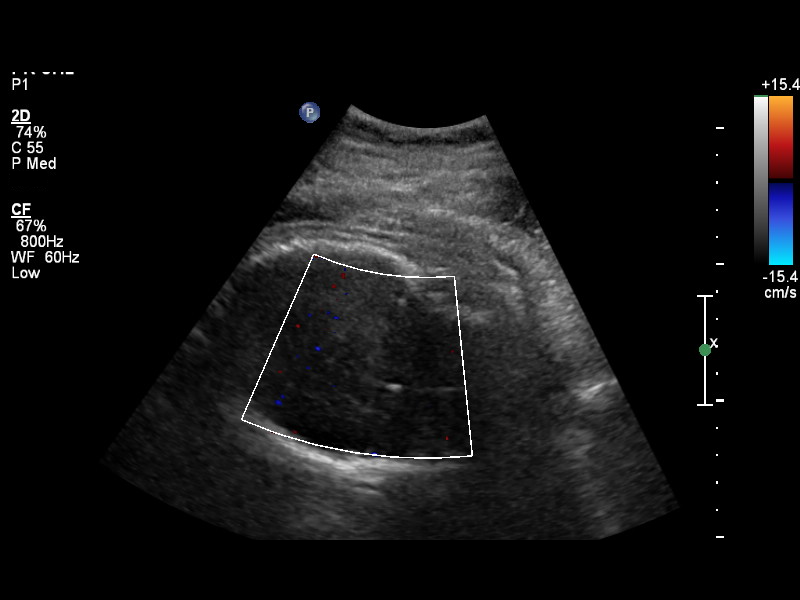

[5 of 5 positions shown; findings below may reference images not displayed]

OBSTETRICS REPORT
(Signed Final 09/16/2014 [DATE])

Name:       AMNON TIGER                         Visit  09/16/2014 [DATE]
Date:

By:
Service(s) Provided

[HOSPITAL]                                          76815.0
Indications

36 weeks gestation of pregnancy
Polyhydramnios, third trimester, antepartum
condition or complication, unspecified fetus
Pre-existing diabetes, type 2, in pregnancy, third
trimester (on glyburide and metformin) - normal
(limited) fetal ECHO
Hypertension - Chronic/Pre-existing
Previous cesarean section
Obesity complicating pregnancy
Fetal Evaluation

Num Of             1
Fetuses:
Cardiac Activity:  Not visualized
Presentation:      Cephalic
Gestational Age

LMP:           36w 3d        Date:  01/04/14                  EDD:   10/11/14
Best:          36w 3d    Det. By:   LMP  (01/04/14)           EDD:   10/11/14
Impression

SIUP at 36+3 weeks
Fetal demise
Cephalic presentation
Recommendations

Dr. Edgar Gustavo with pt

## 2016-07-09 IMAGING — CR DG CHEST 2V
2 series · 2 of 2 positions shown · non-contrast
Comparison: None.

CLINICAL DATA: Five days postpartum. Chest pain, shortness of
Breath, cough.

EXAM:
CHEST  2 VIEW

[chest pa]
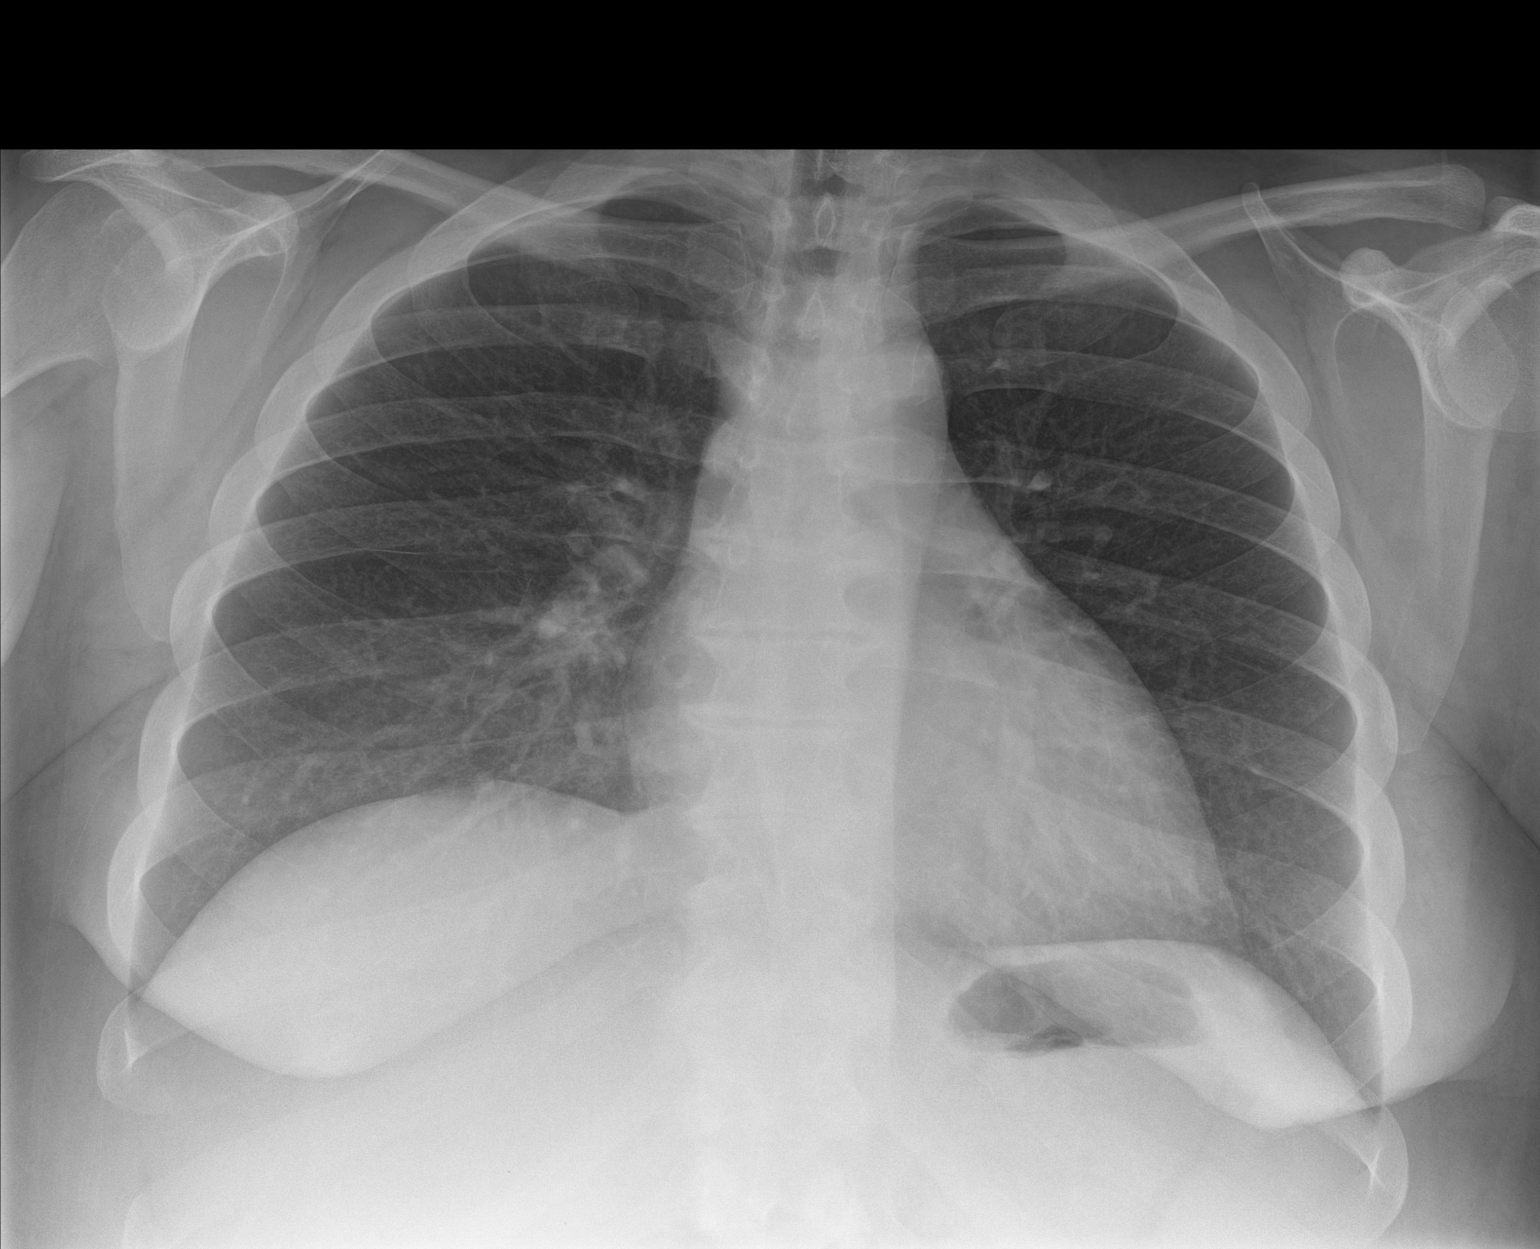

[chest lat]
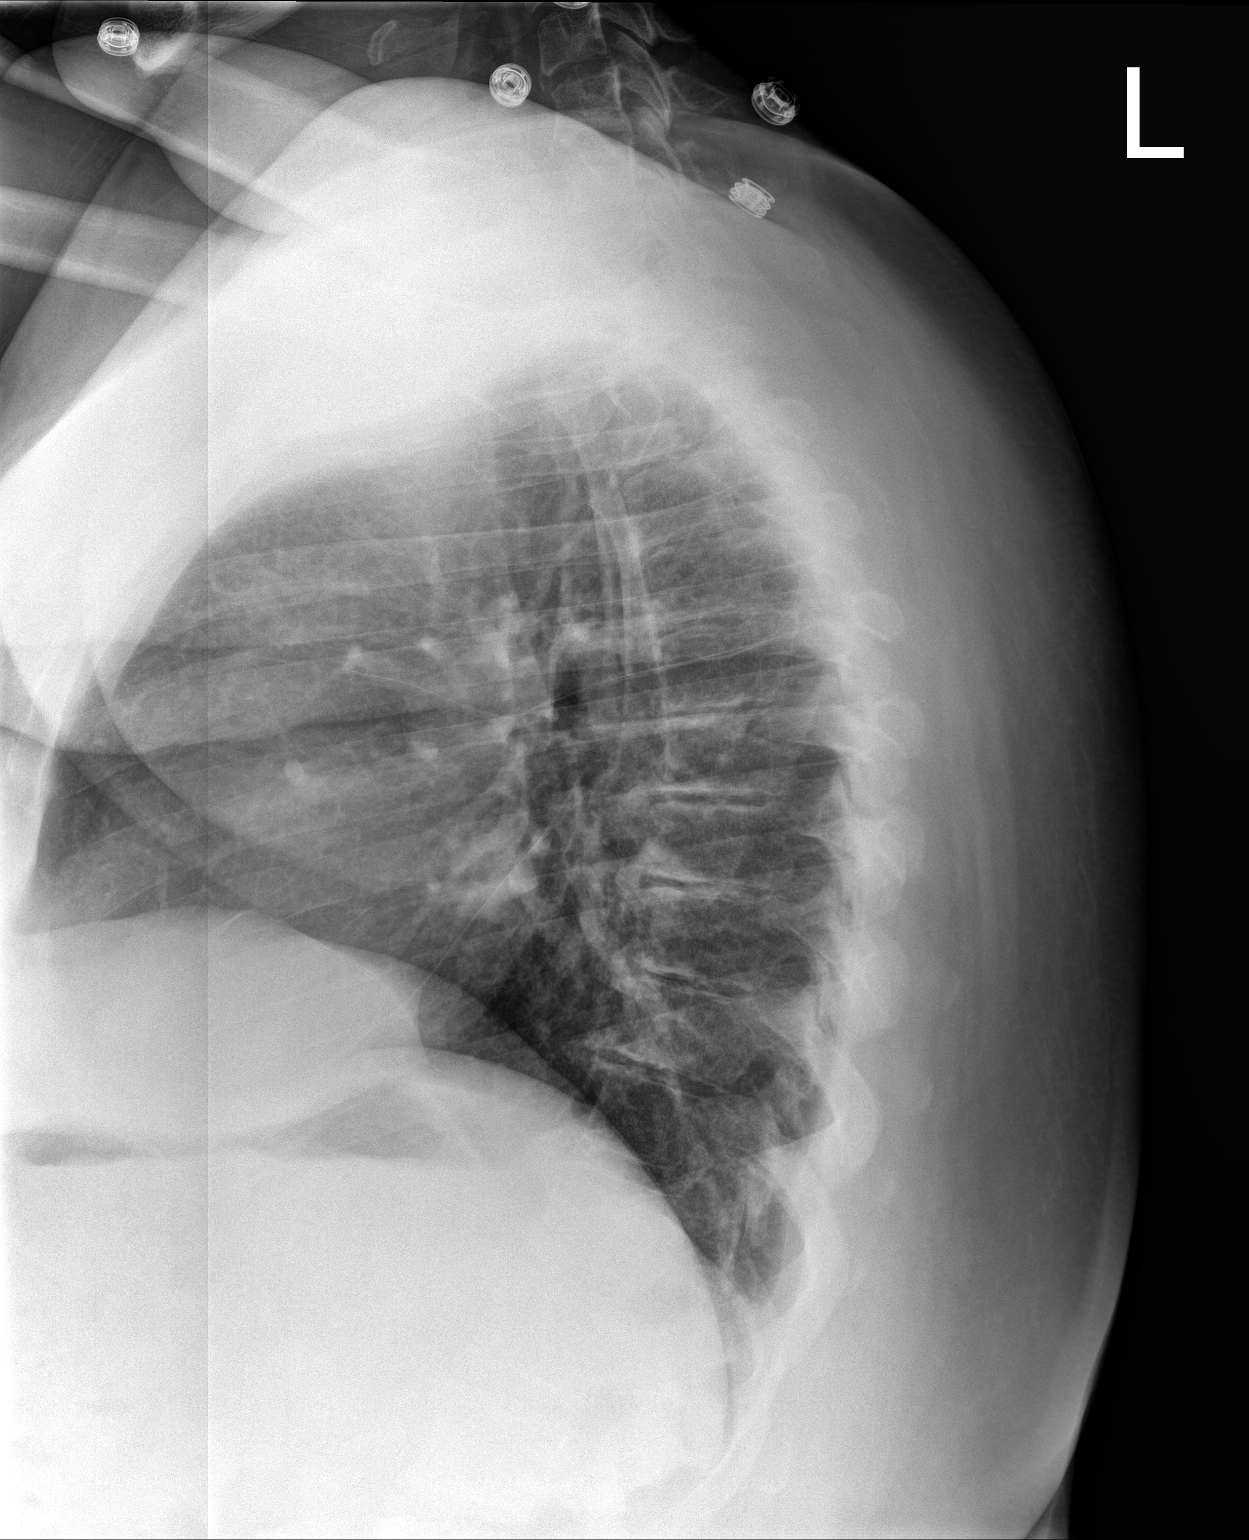

[2 of 2 positions shown; findings below may reference images not displayed]

FINDINGS: The heart size and mediastinal contours are within normal limits.
Both lungs are clear. The visualized skeletal structures are
unremarkable.
IMPRESSION: No active cardiopulmonary disease.

## 2017-09-20 ENCOUNTER — Encounter (HOSPITAL_COMMUNITY): Payer: Self-pay | Admitting: Emergency Medicine

## 2017-09-20 ENCOUNTER — Other Ambulatory Visit: Payer: Self-pay

## 2017-09-20 ENCOUNTER — Emergency Department (HOSPITAL_COMMUNITY)
Admission: EM | Admit: 2017-09-20 | Discharge: 2017-09-21 | Disposition: A | Discharge status: Home / Self Care | Payer: Self-pay | Attending: Emergency Medicine | Admitting: Emergency Medicine

## 2017-09-20 DIAGNOSIS — E119 Type 2 diabetes mellitus without complications: Secondary | ICD-10-CM | POA: Insufficient documentation

## 2017-09-20 DIAGNOSIS — G35 Multiple sclerosis: Secondary | ICD-10-CM | POA: Insufficient documentation

## 2017-09-20 DIAGNOSIS — Z87891 Personal history of nicotine dependence: Secondary | ICD-10-CM | POA: Insufficient documentation

## 2017-09-20 DIAGNOSIS — G369 Acute disseminated demyelination, unspecified: Secondary | ICD-10-CM | POA: Insufficient documentation

## 2017-09-20 DIAGNOSIS — R52 Pain, unspecified: Secondary | ICD-10-CM

## 2017-09-20 LAB — CBC WITH DIFFERENTIAL/PLATELET
ABS IMMATURE GRANULOCYTES: 0 10*3/uL (ref 0.0–0.1)
Basophils Absolute: 0 10*3/uL (ref 0.0–0.1)
Basophils Relative: 0 %
Eosinophils Absolute: 0 10*3/uL (ref 0.0–0.7)
Eosinophils Relative: 0 %
HCT: 46.8 % — ABNORMAL HIGH (ref 36.0–46.0)
Hemoglobin: 15.8 g/dL — ABNORMAL HIGH (ref 12.0–15.0)
Immature Granulocytes: 0 %
LYMPHS PCT: 15 %
Lymphs Abs: 1.5 10*3/uL (ref 0.7–4.0)
MCH: 29.3 pg (ref 26.0–34.0)
MCHC: 33.8 g/dL (ref 30.0–36.0)
MCV: 86.7 fL (ref 78.0–100.0)
Monocytes Absolute: 0.6 10*3/uL (ref 0.1–1.0)
Monocytes Relative: 6 %
NEUTROS ABS: 7.6 10*3/uL (ref 1.7–7.7)
Neutrophils Relative %: 79 %
PLATELETS: 264 10*3/uL (ref 150–400)
RBC: 5.4 MIL/uL — AB (ref 3.87–5.11)
RDW: 13.1 % (ref 11.5–15.5)
WBC: 9.7 10*3/uL (ref 4.0–10.5)

## 2017-09-20 LAB — COMPREHENSIVE METABOLIC PANEL
ALK PHOS: 96 U/L (ref 38–126)
ALT: 31 U/L (ref 0–44)
AST: 19 U/L (ref 15–41)
Albumin: 3.2 g/dL — ABNORMAL LOW (ref 3.5–5.0)
Anion gap: 11 (ref 5–15)
BILIRUBIN TOTAL: 1.3 mg/dL — AB (ref 0.3–1.2)
BUN: 10 mg/dL (ref 6–20)
CALCIUM: 7.9 mg/dL — AB (ref 8.9–10.3)
CO2: 21 mmol/L — ABNORMAL LOW (ref 22–32)
Chloride: 98 mmol/L (ref 98–111)
Creatinine, Ser: 0.68 mg/dL (ref 0.44–1.00)
Glucose, Bld: 336 mg/dL — ABNORMAL HIGH (ref 70–99)
POTASSIUM: 3.5 mmol/L (ref 3.5–5.1)
Sodium: 130 mmol/L — ABNORMAL LOW (ref 135–145)
Total Protein: 7 g/dL (ref 6.5–8.1)

## 2017-09-20 NOTE — ED Triage Notes (Signed)
Pt reports she believes she is having a "MS flair up".  Pt reports generalized pain 6/10 for 3 days.  Pt has had headaches for the past year however this past three days have been "bad".  Feels like her face is twitching and she "is on a vibrating machine."  Emesis yesterday numerous times.

## 2017-09-21 ENCOUNTER — Emergency Department: Payer: Self-pay

## 2017-09-21 ENCOUNTER — Emergency Department (HOSPITAL_COMMUNITY): Payer: Self-pay

## 2017-09-21 DIAGNOSIS — G35 Multiple sclerosis: Secondary | ICD-10-CM

## 2017-09-21 MED ORDER — GADOBENATE DIMEGLUMINE 529 MG/ML IV SOLN
20.0000 mL | Freq: Once | INTRAVENOUS | Status: AC | PRN
  Administered 2017-09-21: 20 mL via INTRAVENOUS

## 2017-09-21 MED ORDER — LORAZEPAM 2 MG/ML IJ SOLN
2.0000 mg | Freq: Once | INTRAMUSCULAR | Status: AC
  Administered 2017-09-21: 2 mg via INTRAVENOUS
  Filled 2017-09-21: qty 1

## 2017-09-21 MED ORDER — METFORMIN HCL 1000 MG PO TABS: 500.0000 mg | ORAL_TABLET | Freq: Two times a day (BID) | ORAL | 0 refills | Status: AC

## 2017-09-21 NOTE — ED Notes (Signed)
Patient verbalizes understanding of discharge instructions. Opportunity for questioning and answers were provided. Armband removed by staff, pt discharged from ED ambulatory.   

## 2017-09-21 NOTE — ED Provider Notes (Signed)
[facility] EMERGENCY DEPARTMENT Provider Note   CSN: [id number] Arrival date & time: 09/20/17  1944     History   Chief Complaint Chief Complaint  Patient presents with  . MS flair    HPI Morgan Allen is a 37 y.o. female.  HPI Pt comes in with cc of MS flair up. Pt has hx of MS, obesity. She reports that she has been feeling weak the last few days and along with that she has facial twitching and  Tongue swelling/lip swelling leading to slurring of the speech. Pt also has been having headaches for he last several weeks, but her headaches have been worse the last 3 days.  Pt had similar symptoms with MS. She hasnt been on any MS meds and has been lost to f/u due to lack of insurance.   Past Medical History:  Diagnosis Date  . Gestational diabetes   . Obesity   . Pregnancy induced hypertension   . Pregnant     Patient Active Problem List   Diagnosis Date Noted  . Hypertension in pregnancy, preeclampsia, severe, postpartum condition 09/23/2014  . Acute pulmonary edema (HCC) 09/23/2014  . Diabetes mellitus, type II (HCC) 09/23/2014  . Rubella non-immune status, antepartum 09/06/2014  . Generalized anxiety disorder 09/05/2014  . Previous cesarean delivery, antepartum condition or complication     Past Surgical History:  Procedure Laterality Date  . CESAREAN SECTION       OB History    Gravida  2   Para  2   Term  2   Preterm      AB      Living  1     SAB      TAB      Ectopic      Multiple  0   Live Births  1            Home Medications    Prior to Admission medications   Medication Sig Start Date End Date Taking? Authorizing Provider  acetaminophen (TYLENOL) 325 MG tablet Take 650 mg by mouth every 6 (six) hours as needed for mild pain or fever.   Yes [provider]  ibuprofen (ADVIL,MOTRIN) 200 MG tablet Take 200 mg by mouth every 6 (six) hours as needed for fever or mild pain.   Yes [provider]  glyBURIDE micronized (GLYNASE) 6 MG tablet Take 1 tablet (6 mg total) by mouth 2 (two) times daily before a meal. Patient not taking: Reported on 10/21/2014 09/19/14   Clemmons, Elmore Guise, CNM  labetalol (NORMODYNE) 200 MG tablet Take 2 tablets (400 mg total) by mouth 2 (two) times daily. Patient not taking: Reported on 10/21/2014 09/26/14   Adam Phenix, MD  metFORMIN (GLUCOPHAGE) 1000 MG tablet Take 0.5 tablets (500 mg total) by mouth 2 (two) times daily. 09/21/17   Derwood Kaplan, MD    Family History Family History  Problem Relation Age of Onset  . Diabetes Father   . Hypertension Father   . Stroke Father   . Cancer Paternal Grandmother     Social History Social History   Tobacco Use  . Smoking status: Former Smoker  Substance Use Topics  . Alcohol use: No  . Drug use: No     Allergies   Tramadol   Review of Systems Review of Systems  Constitutional: Positive for activity change.  Respiratory: Negative for shortness of breath.   Cardiovascular: Negative for chest pain.  Skin: Negative for  rash.  Neurological: Positive for speech difficulty and headaches.  All other systems reviewed and are negative.    Physical Exam Updated Vital Signs BP 123/73   Pulse (!) 115   Temp 98.2 F (36.8 C) (Oral)   Resp 17   SpO2 98%   Physical Exam  Constitutional: She is oriented to person, place, and time. She appears well-developed.  HENT:  Head: Normocephalic and atraumatic.  Eyes: EOM are normal.  Neck: Normal range of motion. Neck supple.  Cardiovascular: Normal rate.  Pulmonary/Chest: Effort normal.  Abdominal: Bowel sounds are normal.  Neurological: She is alert and oriented to person, place, and time.  Subjective numbness /tingling over the upper extremities bilaterally. No dysmetria.  Skin: Skin is warm and dry.  Nursing note and vitals reviewed.    ED Treatments / Results  Labs (all labs ordered are listed, but only abnormal results are displayed) Labs  Reviewed  COMPREHENSIVE METABOLIC PANEL - Abnormal; Notable for the following components:      Result Value   Sodium 130 (*)    CO2 21 (*)    Glucose, Bld 336 (*)    Calcium 7.9 (*)    Albumin 3.2 (*)    Total Bilirubin 1.3 (*)    All other components within normal limits  CBC WITH DIFFERENTIAL/PLATELET - Abnormal; Notable for the following components:   RBC 5.40 (*)    Hemoglobin 15.8 (*)    HCT 46.8 (*)    All other components within normal limits    EKG None  Radiology Mr Laqueta Jean And Wo Contrast  Result Date: 09/21/2017 CLINICAL DATA:  Generalized pain and for 3 days. Recurrent headaches. Facial twitching. Vomiting yesterday. Multiple sclerosis flare up. EXAM: MRI HEAD WITHOUT AND WITH CONTRAST MRI CERVICAL SPINE WITHOUT AND WITH CONTRAST TECHNIQUE: Multiplanar, multiecho pulse sequences of the brain and surrounding structures, and cervical spine, to include the craniocervical junction and cervicothoracic junction, were obtained without and with intravenous contrast. CONTRAST:  69mL MULTIHANCE GADOBENATE DIMEGLUMINE 529 MG/ML IV SOLN COMPARISON:  CT HEAD and cervical spine November 24, 2014 FINDINGS: MRI HEAD FINDINGS INTRACRANIAL CONTENTS: No reduced diffusion to suggest acute ischemia or hyperacute demyelination. Greater than 10 supratentorial white matter and to lesser extent cortical lesions with T2 shine through and load T1 signal compatible with black holes of demyelination. Dominant lesion 15 mm LEFT frontal juxta cortical white matter. Many lesions radiate from the periventricular margin. No parenchymal brain volume loss for age. No midline shift, mass effect or masses. No abnormal extra-axial fluid collections. Limited assessment for enhancement due to moderately motion degraded post gadolinium T1 sequences. No definite enhancement. VASCULAR: Normal major intracranial vascular flow voids present at skull base. SKULL AND UPPER CERVICAL SPINE: No abnormal sellar expansion. No  suspicious calvarial bone marrow signal. Craniocervical junction maintained. SINUSES/ORBITS: The mastoid air-cells and included paranasal sinuses are well-aerated.The included ocular globes and orbital contents are non-suspicious. OTHER: None. MRI CERVICAL SPINE FINDINGS-moderately motion degraded examination. ALIGNMENT: Straightened cervical lordosis.  No malalignment. VERTEBRAE/DISCS: Vertebral bodies are intact. Intervertebral disc morphology's and signal are normal. Congenital canal narrowing on the basis of foreshortened pedicles. No abnormal or acute bone marrow signal. No suspicious osseous or disc enhancement. CORD:Bright STIR signal and gradient signal C5 concerning for demyelination though axial sequences are moderately motion degraded. No abnormal cord, LEFT-dominant or epidural enhancement. POSTERIOR FOSSA, VERTEBRAL ARTERIES, PARASPINAL TISSUES: No MR findings of ligamentous injury. Vertebral artery flow voids present. DISC LEVELS: C2-3 through C4-5: No disc bulge, canal  stenosis nor neural foraminal narrowing. C5-6: Uncovertebral hypertrophy. No canal stenosis. Mild neural foraminal narrowing versus motion artifact. C6-7: Central 4 x 5 mm disc extrusion with 19 mm superior to inferior contiguous migration deforming the ventral spinal cord resulting in moderate canal stenosis. No neural foraminal narrowing. IMPRESSION: MRI HEAD: 1. Greater than 10 supratentorial lesions in a distribution compatible with demyelination. No identified enhancement though post gadolinium sequences are moderately motion degraded. 2. No parenchymal brain volume loss for age. MRI CERVICAL SPINE: 1. Moderately motion degraded examination. 2. Spinal cord chronic demyelinating plaque at C5 versus artifact, no enhancement to suggest acute demyelination. 3. Large C6-7 disc extrusion resulting in cord deformity and moderate canal stenosis. 4. Mild C5-6 neural foraminal narrowing versus artifact. Electronically Signed   By: Awilda Metro M.D.   On: 09/21/2017 05:49   Mr Cervical Spine W Or Wo Contrast  Result Date: 09/21/2017 CLINICAL DATA:  Generalized pain and for 3 days. Recurrent headaches. Facial twitching. Vomiting yesterday. Multiple sclerosis flare up. EXAM: MRI HEAD WITHOUT AND WITH CONTRAST MRI CERVICAL SPINE WITHOUT AND WITH CONTRAST TECHNIQUE: Multiplanar, multiecho pulse sequences of the brain and surrounding structures, and cervical spine, to include the craniocervical junction and cervicothoracic junction, were obtained without and with intravenous contrast. CONTRAST:  20mL MULTIHANCE GADOBENATE DIMEGLUMINE 529 MG/ML IV SOLN COMPARISON:  CT HEAD and cervical spine November 24, 2014 FINDINGS: MRI HEAD FINDINGS INTRACRANIAL CONTENTS: No reduced diffusion to suggest acute ischemia or hyperacute demyelination. Greater than 10 supratentorial white matter and to lesser extent cortical lesions with T2 shine through and load T1 signal compatible with black holes of demyelination. Dominant lesion 15 mm LEFT frontal juxta cortical white matter. Many lesions radiate from the periventricular margin. No parenchymal brain volume loss for age. No midline shift, mass effect or masses. No abnormal extra-axial fluid collections. Limited assessment for enhancement due to moderately motion degraded post gadolinium T1 sequences. No definite enhancement. VASCULAR: Normal major intracranial vascular flow voids present at skull base. SKULL AND UPPER CERVICAL SPINE: No abnormal sellar expansion. No suspicious calvarial bone marrow signal. Craniocervical junction maintained. SINUSES/ORBITS: The mastoid air-cells and included paranasal sinuses are well-aerated.The included ocular globes and orbital contents are non-suspicious. OTHER: None. MRI CERVICAL SPINE FINDINGS-moderately motion degraded examination. ALIGNMENT: Straightened cervical lordosis.  No malalignment. VERTEBRAE/DISCS: Vertebral bodies are intact. Intervertebral disc morphology's and  signal are normal. Congenital canal narrowing on the basis of foreshortened pedicles. No abnormal or acute bone marrow signal. No suspicious osseous or disc enhancement. CORD:Bright STIR signal and gradient signal C5 concerning for demyelination though axial sequences are moderately motion degraded. No abnormal cord, LEFT-dominant or epidural enhancement. POSTERIOR FOSSA, VERTEBRAL ARTERIES, PARASPINAL TISSUES: No MR findings of ligamentous injury. Vertebral artery flow voids present. DISC LEVELS: C2-3 through C4-5: No disc bulge, canal stenosis nor neural foraminal narrowing. C5-6: Uncovertebral hypertrophy. No canal stenosis. Mild neural foraminal narrowing versus motion artifact. C6-7: Central 4 x 5 mm disc extrusion with 19 mm superior to inferior contiguous migration deforming the ventral spinal cord resulting in moderate canal stenosis. No neural foraminal narrowing. IMPRESSION: MRI HEAD: 1. Greater than 10 supratentorial lesions in a distribution compatible with demyelination. No identified enhancement though post gadolinium sequences are moderately motion degraded. 2. No parenchymal brain volume loss for age. MRI CERVICAL SPINE: 1. Moderately motion degraded examination. 2. Spinal cord chronic demyelinating plaque at C5 versus artifact, no enhancement to suggest acute demyelination. 3. Large C6-7 disc extrusion resulting in cord deformity and moderate canal stenosis. 4. Mild  C5-6 neural foraminal narrowing versus artifact. Electronically Signed   By: Awilda Metro M.D.   On: 09/21/2017 05:49   Dg Outside Films Chest  Result Date: 09/21/2017 This examination belongs to an outside facility and is stored here for comparison purposes only.  Contact the originating outside institution for any associated report or interpretation.  Ct Outside Films Head/face  Result Date: 09/21/2017 This examination belongs to an outside facility and is stored here for comparison purposes only.  Contact the originating  outside institution for any associated report or interpretation.   Procedures Procedures (including critical care time)  Medications Ordered in ED Medications  LORazepam (ATIVAN) injection 2 mg (2 mg Intravenous Given 09/21/17 0341)  gadobenate dimeglumine (MULTIHANCE) injection 20 mL (20 mLs Intravenous Contrast Given 09/21/17 0524)     Initial Impression / Assessment and Plan / ED Course  I have reviewed the triage vital signs and the nursing notes.  Pertinent labs & imaging results that were available during my care of the patient were reviewed by me and considered in my medical decision making (see chart for details).     37 year old female with history of diabetes, questionable demyelinating syndrome like MS which was diagnosed last year comes in with headaches, slurred speech, facial and extremity numbness, dizziness.  It seems like patient is having symptoms that are similar to her demyelinating syndrome.  She has no objective deficits.  She brought records from the outside hospital, her outside imaging will be uploaded.  I have reviewed the discharge summary from the other hospital, and consulted our neurologist.  Dr. Amada Jupiter requested that we get MRI with and without contrast of her brain and C-spine, which has been completed.  Results of the MRI did not show any worsening of the demyelinating process.  I spoke with Dr. Amada Jupiter, who is hesitant at calling the patient MS flareup at this time, and thinks that we need to have her see outpatient neurologist for further differentiation of her diagnosis.  Therefore we will discharge patient with a diagnosis of acute demyelinating process.  Patient was given free clinic information from the other hospital.  Patient never followed up with them.  Patient does not have insurance, I informed her that she needs to follow-up with the free clinic for optimal management of her medical conditions, and to see if they can give her neuro  follow-up.  For now I think she needs to follow-up with outpatient neurology in [place], and we have provided her with appropriate information for the follow-up.  The results from the ER have been discussed with the patient.  Dr. Amada Jupiter also recommended that patient be not started on steroids at this time, and that we focus more on managing her blood sugar more tightly.  Patient has been informed to start taking diabetes medications, and to return to the ER if her symptoms get worse.  Patient and her friend are in agreement with the plan.  Final Clinical Impressions(s) / ED Diagnoses   Final diagnoses:  Acute disseminated demyelination, unspecified [facility])    ED Discharge Orders        Ordered    metFORMIN (GLUCOPHAGE) 1000 MG tablet  2 times daily     09/21/17 0737       Derwood Kaplan, MD 09/21/17 [id number]

## 2017-09-21 NOTE — Discharge Instructions (Signed)
Please take the medications prescribed for optimal diabetes control. We encouraged that you follow-up at the free clinic, this is a valuable resource that is not easily available. We have provided you with phone number with the neurologist in town, please also see them for optimal management of your condition.  Return to the ER if you have worsening of your symptoms or start having new symptoms like numbness, weakness, vision changes, dizziness, balance issues.

## 2017-09-21 NOTE — ED Notes (Signed)
Patient transported to MRI 

## 2017-09-21 NOTE — Consult Note (Signed)
Neurology Consultation Reason for Consult: Dizziness Referring Physician: Rhunette Croft, A  CC: Dizziness  History is obtained from: Patient, husband  HPI: Morgan Allen is a 37 y.o. female with a history of a single previous episode concerning for ADEM or MS in 07/2017.  She presents tonight with similar complaints of feeling dizzy and feeling like she is going to the right when she walks.  She is, however, still able to take care of her activities of daily living and able to manage at home safely.  She denies numbness, focal weakness, btu does feel like she has been slurring her words.   Her previous work-up from 2018: IgG INdex 07/2016 - 1.0(range 0 - 0.7) CSF WBC 11, 96% lymphs CSF protine 62 CSF glucose 126  ANA neg  Lyme neg [place]  HIV neg  MRI brain - multifocal demyelinating lesions MRI C-spine C5 lesion.    ROS: A 14 point ROS was performed and is negative except as noted in the HPI.   Past Medical History:  Diagnosis Date  . Gestational diabetes   . Obesity   . Pregnancy induced hypertension   . Pregnant      Family History  Problem Relation Age of Onset  . Diabetes Father   . Hypertension Father   . Stroke Father   . Cancer Paternal Grandmother      Social History:  reports that she has quit smoking. She does not have any smokeless tobacco history on file. She reports that she does not drink alcohol or use drugs.   Exam: Current vital signs: BP 125/86 (BP Location: Right Arm)   Pulse (!) 116   Temp 98.2 F (36.8 C) (Oral)   Resp 15   SpO2 98%  Vital signs in last 24 hours: Temp:  [98.2 F (36.8 C)-99 F (37.2 C)] 98.2 F (36.8 C) (07/28 0039) Pulse Rate:  [116-131] 116 (07/28 0039) Resp:  [15-16] 15 (07/28 0039) BP: (125-141)/(86-99) 125/86 (07/28 0039) SpO2:  [98 %-99 %] 98 % (07/28 0039)   Physical Exam  Constitutional: Appears well-developed and well-nourished.  Psych: Affect appropriate to situation Eyes: No scleral  injection HENT: No OP obstrucion Head: Normocephalic.  Cardiovascular: Normal rate and regular rhythm.  Respiratory: Effort normal, non-labored breathing GI: Soft.  No distension. There is no tenderness.  Skin: WDI  Neuro: Mental Status: Patient is awake, alert, oriented to person, place, month, year, and situation. Patient is able to give a clear and coherent history. No signs of aphasia or neglect Cranial Nerves: II: Visual Fields are full. Pupils are equal, round, and reactive to light.   III,IV, VI: EOMI without ptosis or diploplia.  V: Facial sensation is symmetric to temperature VII: Facial movement is symmetric.  VIII: hearing is intact to voice X: Uvula elevates symmetrically XI: Shoulder shrug is symmetric. XII: tongue is midline without atrophy or fasciculations.  Motor: Tone is normal. Bulk is normal. 5/5 strength was present in all four extremities.  Sensory: Sensation is symmetric to light touch and temperature in the arms and legs. Cerebellar: FNF  intact bilaterally Gait: Slight right ward lean, but stable gait. Romberg negative.      I have reviewed labs in epic and the results pertinent to this consultation are: Elevated glucose, normal sodium.   I have reviewed the images obtained: Previous MRI from 2018-multifocal lesions which could be consistent with tumefactive MS or ADEM, less likely tumors or infection especially now given clinical course  Impression: 37 year old female with  likely some type of demyelinating disease with mild symptoms which could be consistent with relapse versus recrudescence.  I would favor getting an MRI of her brain and cervical spine, if there is any enhancement that I would favor doing high-dose steroids.  Recommendations: 1) MRI brain and cervical spine 2) if there is enhancement, then I would favor doing high-dose steroids, given that she has diabetes, and may be prudent to admit her for IV Solu-Medrol as well as physical  therapy so that her blood sugars can be monitored as well   Ritta Slot, MD [facility] [phone]  If 7pm- 7am, please page neurology on call as listed in AMION.

## 2017-09-21 NOTE — ED Notes (Signed)
Pt returned from MRI °

## 2019-07-22 ENCOUNTER — Telehealth: Payer: Self-pay | Admitting: *Deleted

## 2019-07-22 ENCOUNTER — Other Ambulatory Visit: Payer: Self-pay

## 2019-07-22 ENCOUNTER — Encounter: Payer: Self-pay | Admitting: Neurology

## 2019-07-22 ENCOUNTER — Ambulatory Visit (INDEPENDENT_AMBULATORY_CARE_PROVIDER_SITE_OTHER): Payer: BC Managed Care – PPO | Admitting: Neurology

## 2019-07-22 VITALS — BP 142/80 | HR 96 | Ht 61.75 in | Wt 264.5 lb

## 2019-07-22 DIAGNOSIS — N3941 Urge incontinence: Secondary | ICD-10-CM

## 2019-07-22 DIAGNOSIS — M50223 Other cervical disc displacement at C6-C7 level: Secondary | ICD-10-CM

## 2019-07-22 DIAGNOSIS — G379 Demyelinating disease of central nervous system, unspecified: Secondary | ICD-10-CM | POA: Diagnosis not present

## 2019-07-22 DIAGNOSIS — R200 Anesthesia of skin: Secondary | ICD-10-CM | POA: Diagnosis not present

## 2019-07-22 DIAGNOSIS — R5383 Other fatigue: Secondary | ICD-10-CM

## 2019-07-22 DIAGNOSIS — Z6841 Body Mass Index (BMI) 40.0 and over, adult: Secondary | ICD-10-CM | POA: Insufficient documentation

## 2019-07-22 DIAGNOSIS — E559 Vitamin D deficiency, unspecified: Secondary | ICD-10-CM

## 2019-07-22 DIAGNOSIS — R9082 White matter disease, unspecified: Secondary | ICD-10-CM | POA: Diagnosis not present

## 2019-07-22 DIAGNOSIS — G35 Multiple sclerosis: Secondary | ICD-10-CM | POA: Insufficient documentation

## 2019-07-22 NOTE — Progress Notes (Signed)
[facility]  PATIENT: Morgan Allen DOB: 01/15/1981  REFERRING DOCTOR OR PCP:  Kirstie Peri SOURCE: Patient, notes from PCP.  _________________________________   HISTORICAL  CHIEF COMPLAINT:  Chief Complaint  Patient presents with  . New Patient (Initial Visit)    RM 13, alone. Paper referral from Dr. Sherryll Burger for MS. She did not bring MRI CD's. She could not get a copy. She signed a release form today, Stanton Kidney working on getting CD's. She used to wear glasses but has not in awhile. Last appt with eye doctor in early 2000's. She states vision ok today.  Marland Kitchen Headache    She has daily headaches. States OTC medication does not help. Given roxicodone and this helps the best.     HISTORY OF PRESENT ILLNESS:  I had the pleasure of seeing your patient, Morgan Allen, at the MS center at [facility] for neurologic consultation regarding her possible multiple sclerosis.  She is a 39 year old woman who has an abnormal MRI and symptoms worrisome for MS.   In June 2018, she was slurring her words and was ataxic like she was drunk.  She was taken to [facility] ED and a CT scan was abnormal. She was sent to a larger hospital.   She had an MRI worrisome for MS.   She had 2 lumbar punctures and they were apparently inconclusive.   She was seen by neurosurgery and a brain biopsy was discussed but she declined.   She was infused with steroids and improved.    A year later she was slurring words again and her face was drooping.  She went to [facility] ED and MRIs show lesions c/w MS (periventricular and juxtacortical)  Currently she is numb in her left face involving the tongue.  This started a month ago and is improving some, as far as numbness, but she notes more pain in the tongue.   She gets a stabbing pain in random places at times (arms, legs) and she has some paresthesias and dysesthesias in limbs and right scalp.   Vision is fine (supposed to wear glasses) .   She has  had urge incontinence, better with sugars under control.     She has some fatigue and heat intolerance.   She has insomnia and sleeps < 5 hours some nights due to sleep onset and sleep maintenance issues.    She has had neck pain for several years as well.    She hears crunching in her neck   She has psoriasis.   Her grandmother had a poor gait and other neurologic issues but never got a diagnosis.   She died around 60 with cancer.     I personally reviewed the MRIs of the brain, cervical spine and thoracic spine from 09/02/2017 and 10/28/2018.  The MRI of the brain shows multiple T2/FLAIR hyperintense foci, some of the periventricular and juxtacortical white matter.  There is a subtle focus that may also be in the posterior inferior right pons.  None of the foci enhance.  The MRI of the cervical spine shows a T2 hyperintense focus adjacent to C5, anteriorly.  Additionally, there is a large herniated disc arising from C6-C7 with caudal migration causing significant spinal stenosis and indentation of the spinal cord.  There is no myelopathic signal.  There was some abnormal signal adjacent to the T10 and T11 vertebral bodies and some mild marrow edema at T10-T11 that was further evaluated with a CT scan that did  not show any evidence of osteomyelitis  REVIEW OF SYSTEMS: Constitutional: No fevers, chills, sweats, or change in appetite.  She notes fatigue and heat intolerance.  She has an irregular sleep schedule and insomnia. Eyes: No visual changes, double vision, eye pain Ear, nose and throat: No hearing loss, ear pain, nasal congestion, sore throat Cardiovascular: No chest pain, palpitations Respiratory: No shortness of breath at rest or with exertion.   No wheezes GastrointestinaI: No nausea, vomiting, diarrhea, abdominal pain, fecal incontinence Genitourinary: No dysuria, urinary retention or frequency.  No nocturia. Musculoskeletal: No neck pain, back pain Integumentary: No rash, pruritus, skin  lesions.  She has psoriasis.  It is mild. Neurological: as above Psychiatric: No depression at this time.  No anxiety Endocrine: No palpitations, diaphoresis, change in appetite, change in weigh or increased thirst Hematologic/Lymphatic: No anemia, purpura, petechiae. Allergic/Immunologic: No itchy/runny eyes, nasal congestion, recent allergic reactions, rashes  ALLERGIES: Allergies  Allergen Reactions  . Other Itching    Grass pollen  . Tramadol Other (See Comments)    Migraine     HOME MEDICATIONS:  Current Outpatient Medications:  .  acetaminophen (TYLENOL) 325 MG tablet, Take 650 mg by mouth every 6 (six) hours as needed for mild pain or fever., Disp: , Rfl:  .  ALPRAZolam (XANAX) 0.5 MG tablet, Take 0.5 mg by mouth 2 (two) times daily as needed for anxiety., Disp: , Rfl:  .  clobetasol ointment (TEMOVATE) 0.05 %, Apply 1 application topically in the morning, at noon, and at bedtime., Disp: , Rfl:  .  ibuprofen (ADVIL,MOTRIN) 200 MG tablet, Take 200 mg by mouth every 6 (six) hours as needed for fever or mild pain., Disp: , Rfl:  .  Insulin Aspart, w/Niacinamide, (FIASP [place]), Inject 5 Units into the skin with breakfast, with lunch, and with evening meal., Disp: , Rfl:  .  Insulin Glargine-Lixisenatide (SOLIQUA) 100-33 UNT-MCG/ML SOPN, Inject 25 Units into the skin in the morning., Disp: , Rfl:  .  metFORMIN (GLUCOPHAGE) 1000 MG tablet, Take 0.5 tablets (500 mg total) by mouth 2 (two) times daily., Disp: 60 tablet, Rfl: 0 .  methocarbamol (ROBAXIN) 500 MG tablet, Take 500 mg by mouth every 6 (six) hours as needed for muscle spasms., Disp: , Rfl:  .  sertraline (ZOLOFT) 50 MG tablet, Take 50 mg by mouth daily., Disp: , Rfl:   PAST MEDICAL HISTORY: Past Medical History:  Diagnosis Date  . Diabetes mellitus with hyperglycemia (HCC)   . Dizziness   . Gestational diabetes   . Joint pain   . Obesity   . Pregnancy induced hypertension   . Pregnant   . Psoriasis arthropathica (HCC)      PAST SURGICAL HISTORY: Past Surgical History:  Procedure Laterality Date  . CESAREAN SECTION  2004    FAMILY HISTORY: Family History  Problem Relation Age of Onset  . Diabetes Father   . Hypertension Father   . Stroke Father   . High Cholesterol Father   . Cancer Paternal Grandmother   . Kidney disease Mother     SOCIAL HISTORY:  Social History   Socioeconomic History  . Marital status: Single    Spouse name: Not on file  . Number of children: Not on file  . Years of education: Not on file  . Highest education level: Not on file  Occupational History  . Not on file  Tobacco Use  . Smoking status: Former Smoker    Quit date: 2004    Years since quitting:  17.4  . Smokeless tobacco: Never Used  Substance and Sexual Activity  . Alcohol use: No    Comment: quit 2004  . Drug use: No  . Sexual activity: Yes  Other Topics Concern  . Not on file  Social History Narrative   Caffeine use: soda daily   Left handed    Social Determinants of Health   Financial Resource Strain:   . Difficulty of Paying Living Expenses:   Food Insecurity:   . Worried About Programme researcher, broadcasting/film/video in the Last Year:   . Barista in the Last Year:   Transportation Needs:   . Freight forwarder (Medical):   Marland Kitchen Lack of Transportation (Non-Medical):   Physical Activity:   . Days of Exercise per Week:   . Minutes of Exercise per Session:   Stress:   . Feeling of Stress :   Social Connections:   . Frequency of Communication with Friends and Family:   . Frequency of Social Gatherings with Friends and Family:   . Attends Religious Services:   . Active Member of Clubs or Organizations:   . Attends Banker Meetings:   Marland Kitchen Marital Status:   Intimate Partner Violence:   . Fear of Current or Ex-Partner:   . Emotionally Abused:   Marland Kitchen Physically Abused:   . Sexually Abused:      PHYSICAL EXAM  Vitals:   07/22/19 0838  BP: (!) 142/80  Pulse: 96  SpO2: 97%  Weight:  264 lb 8 oz (120 kg)  Height: 5' 1.75" (1.568 m)    Body mass index is 48.77 kg/m.   Hearing Screening   125Hz  250Hz  500Hz  1000Hz  2000Hz  3000Hz  4000Hz  6000Hz  8000Hz   Right ear:           Left ear:             Visual Acuity Screening   Right eye Left eye Both eyes  Without correction: 20/50 20/50 20/40   With correction:        General: The patient is well-developed and well-nourished and in no acute distress  HEENT:  Head is [place]/AT.  Sclera are anicteric.  Funduscopic exam shows normal optic discs and retinal vessels.  Neck: No carotid bruits are noted.  The neck is nontender.  Cardiovascular: The heart has a regular rate and rhythm with a normal S1 and S2. There were no murmurs, gallops or rubs.    Skin: Extremities are without rash or  edema.  Musculoskeletal:  Back is nontender  Neurologic Exam  Mental status: The patient is alert and oriented x 3 at the time of the examination. The patient has apparent normal recent and remote memory, with an apparently normal attention span and concentration ability.   Speech is normal.  Cranial nerves: Extraocular movements are full. Pupils are equal, round, and reactive to light and accomodation.  Visual fields are full.  Facial symmetry is present.  She reports slightly reduced facial sensation to touch on the left and some allodynia in the right scalp.  Facial strength is normal.  Trapezius and sternocleidomastoid strength is normal. No dysarthria is noted.  The tongue is midline, and the patient has symmetric elevation of the soft palate. No obvious hearing deficits are noted.  Motor:  Muscle bulk is normal.   Tone is normal. Strength is  5 / 5 in all 4 extremities.   Sensory: Sensory testing is intact to pinprick, soft touch and vibration sensation in all 4 extremities.  Coordination: Cerebellar testing reveals good finger-nose-finger and heel-to-shin bilaterally.  Gait and station: Station is normal.   Gait is normal. Tandem gait  is wide. Romberg is negative.   Reflexes: Deep tendon reflexes are symmetric and normal bilaterally.   Plantar responses are flexor.    DIAGNOSTIC DATA (LABS, IMAGING, TESTING) - I reviewed patient records, labs, notes, testing and imaging myself where available.  Lab Results  Component Value Date   WBC 9.7 09/20/2017   HGB 15.8 (H) 09/20/2017   HCT 46.8 (H) 09/20/2017   MCV 86.7 09/20/2017   PLT 264 09/20/2017      Component Value Date/Time   NA 130 (L) 09/20/2017 2000   K 3.5 09/20/2017 2000   CL 98 09/20/2017 2000   CO2 21 (L) 09/20/2017 2000   GLUCOSE 336 (H) 09/20/2017 2000   BUN 10 09/20/2017 2000   CREATININE 0.68 09/20/2017 2000   CREATININE 0.62 09/05/2014 1051   CALCIUM 7.9 (L) 09/20/2017 2000   PROT 7.0 09/20/2017 2000   ALBUMIN 3.2 (L) 09/20/2017 2000   AST 19 09/20/2017 2000   ALT 31 09/20/2017 2000   ALKPHOS 96 09/20/2017 2000   BILITOT 1.3 (H) 09/20/2017 2000   GFRNONAA >60 09/20/2017 2000   GFRAA >60 09/20/2017 2000   No results found for: CHOL, HDL, LDLCALC, LDLDIRECT, TRIG, CHOLHDL Lab Results  Component Value Date   HGBA1C 6.4 03/21/2014   No results found for: VITAMINB12 No results found for: TSH     ASSESSMENT AND PLAN  Demyelinating changes in brain ([place]) - Plan: CBC with Differential/Platelet, Hepatic function panel, Varicella zoster antibody, IgG, MR CERVICAL SPINE W WO CONTRAST, MR BRAIN W WO CONTRAST, Pan-ANCA, Stratify JCV Antibody Test ([facility]), Neuromyelitis optica autoab, IgG  White matter abnormality on MRI of brain - Plan: Stratify JCV Antibody Test ([facility]), Neuromyelitis optica autoab, IgG  Herniated nucleus pulposus, C6-7  Numbness  Urge incontinence  Other fatigue  BMI 45.0-49.9, adult (HCC)  Vitamin D deficiency - Plan: VITAMIN D 25 Hydroxy (Vit-D Deficiency, Fractures)  In summary, Morgan Allen is a 39 year old woman with an abnormal brain MRI showing periventricular and juxtacortical white matter foci consistent with  multiple sclerosis.  Additionally, there is a focus within the spinal cord adjacent to C5.  She had undergone evaluation for possible MS in the past and apparently the spinal fluid was not definitive.  I do believe that multiple sclerosis is by far the most likely diagnosis and we did discuss disease modifying therapies.  However, I would like to check an MRI of the brain and cervical spine and compare with the 2019 MRIs.  If there have been changes, we can be more certain of the diagnosis.  If the changes are more aggressive, we might want to consider one of the infusion both medications such as Tysabri or Ocrevus.  Otherwise, we will likely start Vumerity or Tecfidera.  We will check blood work and decide on the treatment if it continues to be indicated based on the follow-up MRIs.  If the MRI showed no new lesions, MS is still possible but I would be less certain.  She will return to see me in 3 months or sooner if there are new or worsening neurologic symptoms.  We will let her know the results of the MRI shortly after they are performed.  I am also trying to get the initial MRIs from 2018 to compare with the 2019 MRI.  It is unusual for neurosurgical biopsy to be considered..  It  is possible that the one large lesion in the juxtacortical and deep white matter of the posterior left frontal lobe had a tumefactive appearance at that time.  Appropriate evolution of the changes over time or if there has been new lesions between 2018 and 2019 could also let us be more certain of the diagnosis  Thank you for asking me to see Morgan Allen.  Please let me know if I can be of further assistance with her or other patients in the future.  Morgan Allen A. Epimenio Foot, MD, [facility] 07/22/2019, 9:19 AM Certified in Neurology, Clinical Neurophysiology, Sleep Medicine and Neuroimaging  [facility] [address] [place], [place] [address] [phone]

## 2019-07-22 NOTE — Telephone Encounter (Signed)
Placed JCV lab in [facility] lock box for routine lab pick up. Results pending. 

## 2019-07-23 LAB — HEPATIC FUNCTION PANEL
ALT: 16 IU/L (ref 0–32)
AST: 12 IU/L (ref 0–40)
Albumin: 4.1 g/dL (ref 3.8–4.8)
Alkaline Phosphatase: 98 IU/L (ref 48–121)
Bilirubin Total: 0.5 mg/dL (ref 0.0–1.2)
Bilirubin, Direct: 0.13 mg/dL (ref 0.00–0.40)
Total Protein: 6.9 g/dL (ref 6.0–8.5)

## 2019-07-23 LAB — CBC WITH DIFFERENTIAL/PLATELET
Basophils Absolute: 0.1 10*3/uL (ref 0.0–0.2)
Basos: 1 %
EOS (ABSOLUTE): 0.2 10*3/uL (ref 0.0–0.4)
Eos: 2 %
Hematocrit: 50.1 % — ABNORMAL HIGH (ref 34.0–46.6)
Hemoglobin: 17 g/dL — ABNORMAL HIGH (ref 11.1–15.9)
Immature Grans (Abs): 0 10*3/uL (ref 0.0–0.1)
Immature Granulocytes: 0 %
Lymphocytes Absolute: 2.4 10*3/uL (ref 0.7–3.1)
Lymphs: 24 %
MCH: 29.5 pg (ref 26.6–33.0)
MCHC: 33.9 g/dL (ref 31.5–35.7)
MCV: 87 fL (ref 79–97)
Monocytes Absolute: 0.7 10*3/uL (ref 0.1–0.9)
Monocytes: 7 %
Neutrophils Absolute: 6.8 10*3/uL (ref 1.4–7.0)
Neutrophils: 66 %
Platelets: 252 10*3/uL (ref 150–450)
RBC: 5.77 x10E6/uL — ABNORMAL HIGH (ref 3.77–5.28)
RDW: 13.5 % (ref 11.7–15.4)
WBC: 10.2 10*3/uL (ref 3.4–10.8)

## 2019-07-23 LAB — PAN-ANCA
ANCA Proteinase 3: <3.5 U/mL (ref 0.0–3.5)
Atypical pANCA: <1:20 {titer}
C-ANCA: <1:20 {titer}
Myeloperoxidase Ab: <9 U/mL (ref 0.0–9.0)
P-ANCA: <1:20 {titer}

## 2019-07-23 LAB — VITAMIN D 25 HYDROXY (VIT D DEFICIENCY, FRACTURES): Vit D, 25-Hydroxy: 11.9 ng/mL — ABNORMAL LOW (ref 30.0–100.0)

## 2019-07-23 LAB — NEUROMYELITIS OPTICA AUTOAB, IGG: NMO IgG Autoantibodies: <1.5 U/mL (ref 0.0–3.0)

## 2019-07-23 LAB — VARICELLA ZOSTER ANTIBODY, IGG: Varicella zoster IgG: 377 index (ref >165)

## 2019-07-28 ENCOUNTER — Telehealth: Payer: Self-pay | Admitting: *Deleted

## 2019-07-28 NOTE — Telephone Encounter (Signed)
Request faxed [facility] @ [phone] phone [phone].

## 2019-07-29 NOTE — Telephone Encounter (Signed)
JCV ab positive, index: 3.39. Gave to MD to review

## 2019-08-03 ENCOUNTER — Telehealth: Payer: Self-pay | Admitting: Neurology

## 2019-08-03 NOTE — Telephone Encounter (Signed)
BCBS Auth: NPR Ref # [id number] spoke to [facility] order sent to GI. They will reach out to the patient to schedule.

## 2019-08-05 ENCOUNTER — Telehealth: Payer: Self-pay | Admitting: Neurology

## 2019-08-09 NOTE — Telephone Encounter (Signed)
Patient is scheduled at GI for 08/11/19.

## 2019-08-11 ENCOUNTER — Other Ambulatory Visit: Payer: Self-pay

## 2019-08-11 ENCOUNTER — Ambulatory Visit
Admission: RE | Admit: 2019-08-11 | Discharge: 2019-08-11 | Disposition: A | Discharge status: Home / Self Care | Payer: BC Managed Care – PPO | Source: Ambulatory Visit | Attending: Neurology | Admitting: Neurology

## 2019-08-11 ENCOUNTER — Ambulatory Visit
Admission: RE | Admit: 2019-08-11 | Discharge: 2019-08-11 | Disposition: A | Discharge status: Home / Self Care | Payer: Medicaid Other | Source: Ambulatory Visit | Attending: Neurology | Admitting: Neurology

## 2019-08-11 ENCOUNTER — Telehealth: Payer: Self-pay | Admitting: Neurology

## 2019-08-11 DIAGNOSIS — G379 Demyelinating disease of central nervous system, unspecified: Secondary | ICD-10-CM | POA: Diagnosis not present

## 2019-08-11 DIAGNOSIS — M79603 Pain in arm, unspecified: Secondary | ICD-10-CM

## 2019-08-11 DIAGNOSIS — M50223 Other cervical disc displacement at C6-C7 level: Secondary | ICD-10-CM

## 2019-08-11 DIAGNOSIS — M542 Cervicalgia: Secondary | ICD-10-CM

## 2019-08-11 MED ORDER — GADOBENATE DIMEGLUMINE 529 MG/ML IV SOLN
20.0000 mL | Freq: Once | INTRAVENOUS | Status: AC | PRN
  Administered 2019-08-11: 20 mL via INTRAVENOUS

## 2019-08-11 NOTE — Telephone Encounter (Signed)
I reviewed the MRI of the brain and cervical spine.  I called to discuss and lead to voicemail so I left a message.  I will try to reach her later this week.  The MRI of the brain does show a lesion on the left side of the pons at the trigeminal dorsal root entry zone that could easily explain her left facial numbness.  It had subtle enhancement.  There were 3 other small foci that enhanced consistent with acute lesions.  Overall, there were about 8 new lesions on this MRI not seen on the previous MRI.  This change over time allows [place] to be certain of the diagnosis of MS.  The MRI of the cervical spine showed one focus in the spinal cord, unchanged.  She also has a disc herniation C6-C7, also unchanged.  I had previously discussed different medications.  She is JCV antibody high positive.  Options would include Ocrevus or the S1 P receptor modulators such as Zeposia or Mayzent.  I will discuss further with her when I am able to reach her.

## 2019-08-13 NOTE — Telephone Encounter (Signed)
x

## 2019-08-13 NOTE — Telephone Encounter (Signed)
I called twice again today and left messages to go over the results of the MRI.

## 2019-08-16 MED ORDER — METHYLPREDNISOLONE 4 MG PO TABS: ORAL_TABLET | ORAL | 0 refills | Status: DC

## 2019-08-16 MED ORDER — VITAMIN D (ERGOCALCIFEROL) 1.25 MG (50000 UNIT) PO CAPS: 50000.0000 [IU] | ORAL_CAPSULE | ORAL | 3 refills | Status: DC

## 2019-08-16 NOTE — Telephone Encounter (Signed)
I spoke to [facility] about the MRI results.  They confirm her MS and we need to start a disease modifying therapy.  We have previously discussed several medications, Vumerity, Zeposia  and Ocrevus.  Vumerity would be the safest for her as she has diabetes which would increase the risk of ophthalmologic side effects from [place]..  She is JCV high positive so Tysabri is not an option.  Ocrevus would be an option.  However, I am concerned that she has decided not to get the COVID-19 vaccination.  Due to her weight and diabetes, Ocrevus could add additional risk if she could catch COVID-19.  Additionally, if she does need to have an operation on her neck, the CD20 depleting agents may be associated with a higher risk of infectious complications.  Therefore we will start Vumerity (or dimethyl fumarate if we can't get insurance coverage).  She reports that her neck pain and right greater than left arm pain is worsening.  In the past, she did not get a benefit from a steroid pack or gabapentin.  She is not interested in pursuing a cervical epidural steroid injection.  She would like to proceed with seeing neurosurgery about surgical options.  Her vitamin D was also low.  I will call in high-dose supplements and a steroid pack due to worsening neck pain to her pharmacy.

## 2019-08-17 NOTE — Telephone Encounter (Signed)
Vumerity start form completed and faxed to [employer].  PA initiated via covermymeds. Key: [id number]. Received this notice: "Your information has been submitted to [facility]. [employer] [place] will review the request and notify you of the determination decision directly, typically within 72 hours of receiving all information.  You will also receive your request decision electronically. To check for an update later, open this request again from your dashboard.  If [employer] has not responded within the specified timeframe or if you have any questions about your PA submission, contact [employer]  directly at [phone]."

## 2019-08-18 NOTE — Telephone Encounter (Signed)
Received fax from [facility] requesting office notes for PA request. Faxed office notes to them at [phone]. Received fax confirmation.

## 2019-08-26 NOTE — Telephone Encounter (Signed)
Received notice of approval of vumerity from [employer] dffective from 08/17/2019 through 08/15/2020.  I informed [employer] rep, Crystal of this approval.

## 2019-08-31 NOTE — Telephone Encounter (Signed)
Completed vumerity maintenance dose PA via covermymeds. Key: [id number]. Sent to [employer]. Should have a determination in 3-5 business days.

## 2019-08-31 NOTE — Telephone Encounter (Signed)
Received this notice from [employer] :"[facility] will be filling the prescription - once they process everything on their end, we will assist in scheduling her shipment."

## 2019-09-02 NOTE — Telephone Encounter (Signed)
PA for vumerity maintenance was cancelled since [employer] has already approved vumerity.

## 2019-09-06 NOTE — Telephone Encounter (Signed)
I called pt. She received her shipment of vumerity on 09/03/2019 and started taking it on 09/04/2019. She has noticed some hot flashes and mild nausea but denies any other side effects.  I advised her to let us know of any other side effects from vumerity, questions or concerns. Pt verbalized understanding.

## 2019-10-25 ENCOUNTER — Ambulatory Visit: Payer: BC Managed Care – PPO | Admitting: Neurology

## 2020-01-27 ENCOUNTER — Telehealth: Payer: Self-pay | Admitting: Neurology

## 2020-01-27 ENCOUNTER — Ambulatory Visit: Payer: BC Managed Care – PPO | Admitting: Neurology

## 2020-01-27 ENCOUNTER — Encounter: Payer: Self-pay | Admitting: Neurology

## 2020-01-28 NOTE — Telephone Encounter (Signed)
Error

## 2020-01-31 ENCOUNTER — Ambulatory Visit: Payer: BC Managed Care – PPO | Admitting: Neurology

## 2020-01-31 ENCOUNTER — Ambulatory Visit (INDEPENDENT_AMBULATORY_CARE_PROVIDER_SITE_OTHER): Payer: BC Managed Care – PPO | Admitting: Neurology

## 2020-01-31 ENCOUNTER — Encounter: Payer: Self-pay | Admitting: Neurology

## 2020-01-31 VITALS — BP 136/81 | HR 96 | Ht 61.75 in | Wt 262.0 lb

## 2020-01-31 DIAGNOSIS — E119 Type 2 diabetes mellitus without complications: Secondary | ICD-10-CM | POA: Diagnosis not present

## 2020-01-31 DIAGNOSIS — Z79899 Other long term (current) drug therapy: Secondary | ICD-10-CM | POA: Diagnosis not present

## 2020-01-31 DIAGNOSIS — M50223 Other cervical disc displacement at C6-C7 level: Secondary | ICD-10-CM | POA: Diagnosis not present

## 2020-01-31 DIAGNOSIS — R200 Anesthesia of skin: Secondary | ICD-10-CM

## 2020-01-31 DIAGNOSIS — Z794 Long term (current) use of insulin: Secondary | ICD-10-CM

## 2020-01-31 DIAGNOSIS — G2581 Restless legs syndrome: Secondary | ICD-10-CM

## 2020-01-31 DIAGNOSIS — G35 Multiple sclerosis: Secondary | ICD-10-CM

## 2020-01-31 MED ORDER — ROPINIROLE HCL 0.5 MG PO TABS: 0.5000 mg | ORAL_TABLET | Freq: Three times a day (TID) | ORAL | 5 refills | Status: DC

## 2020-01-31 NOTE — Progress Notes (Signed)
[facility]  PATIENT: Morgan Allen DOB: [birth year]  REFERRING DOCTOR OR PCP:  Kirstie Peri SOURCE: Patient, notes from PCP.  _________________________________   HISTORICAL  CHIEF COMPLAINT:  Chief Complaint  Patient presents with  . Follow-up    RM 13, alone. Last seen 07/22/2019. Had covid-19 week of Thanksgiving. Last day of quarentine 01/24/20. Received monoclonal antibody infusion. Done well since. Has lingering cough but may be d/t her working in [employer]. She has today and tomorrow off work. Still not sleeping well.   . Multiple Sclerosis    On Vumerity     HISTORY OF PRESENT ILLNESS:  She is a 39 y.o. 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   Vumerity was started.  She is tolerating well.  Her neurologic symptoms improved.   She is no longer numb in her left face I  She gets a stabbing pain in random places at times (arms, legs) .   She always rubs the tip of her tongue along her teeth.     Vision is fine (supposed to wear glasses) .   She has milda little urinary urgency, worse if sugars high.     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   An ESI did not help.   In the past, gabapentin caused mental fogginess. Surgery has been discussed (Dr. Conchita Paris) and she prefers to wait.     She has depression and is on Zoloft 100 mg.  She still has some crying spells, sometimes iappropraitely listening ot the radio or watching TV.   Her vitamin D was also low.  She is on supplements.  She has some fatigue and heat intolerance.   She has insomnia, sleep maintenance > sleep onset.  She snores mildly but has not had OSA signs (husband reportedly has never noted).     She moves her legs a lot when laying down and also while asleep.    She had Covid-19 late November 2021 and received the monoclonal Abs.     Shortly after the infusion, she felt much better and is back to baseline now     She has psoriasis.   Her grandmother had a poor gait and other neurologic issues but never got a diagnosis.   She died around 18 with cancer.      MS History:  In June 2018, she was slurring her words and was ataxic like she was drunk.  She was taken to [facility] ED and a CT scan was abnormal. She then had an MRI worrisome for MS.   She had 2 lumbar punctures and they were apparently inconclusive.   She was seen by neurosurgery and a brain biopsy was discussed but she declined.   She was infused with steroids and improved.   In 2019, she was slurring words again and her face was drooping.  She went to [facility] ED and MRIs show lesions c/w MS (periventricular and juxtacortical)  The MRI of the brain 09/02/2017 shows multiple T2/FLAIR hyperintense foci, some of the periventricular and juxtacortical white matter.  There is a subtle focus that may also be in the posterior inferior right pons.  None of the foci enhance.    The MRI of the cervical spine 09/21/2017 shows a T2 hyperintense focus adjacent to C5, anteriorly.  Additionally, there is a large herniated disc arising  from C6-C7 with caudal migration causing significant spinal stenosis and indentation of the spinal cord.  There is no myelopathic signal.    MRI of the thoracic spine 10/28/2018 abnormal signal adjacent to the T10 and T11 vertebral bodies and some mild marrow edema at T10-T11 that was further evaluated with a CT scan that did not show any evidence of osteomyelitis  MRI brain 08/11/2019 shows T2/FLAIR hyperintense foci in the left pons/middle cerebellar peduncle and in the hemispheres in a pattern configuration consistent with demyelinating plaque associated with multiple sclerosis.  There is subtle enhancement of the focus in the pons and enhancement of 3 other foci in the hemispheres consistent with more acute plaque.  Additionally, at least 4 other foci in  the hemispheres are noted on the current MRI and not present on the previous MRI from 2019.  MRI cervical spine 08/11/2019 shows T2 hyperintense focus within the spinal cord anteriorly adjacent to C5.  Though nonspecific, this is consistent with demyelination from multiple sclerosis.  It is unchanged compared to the 2019 MRI.   Disc extrusion at C6-C7 with caudal extension causing moderate spinal stenosis.  It appears unchanged compared to the 2019 MRI.   REVIEW OF SYSTEMS: Constitutional: No fevers, chills, sweats, or change in appetite.  She notes fatigue and heat intolerance.  She has an irregular sleep schedule and insomnia. Eyes: No visual changes, double vision, eye pain Ear, nose and throat: No hearing loss, ear pain, nasal congestion, sore throat Cardiovascular: No chest pain, palpitations Respiratory: No shortness of breath at rest or with exertion.   No wheezes GastrointestinaI: No nausea, vomiting, diarrhea, abdominal pain, fecal incontinence Genitourinary: No dysuria, urinary retention or frequency.  No nocturia. Musculoskeletal: No neck pain, back pain Integumentary: No rash, pruritus, skin lesions.  She has psoriasis.  It is mild. Neurological: as above Psychiatric: No depression at this time.  No anxiety Endocrine: No palpitations, diaphoresis, change in appetite, change in weigh or increased thirst Hematologic/Lymphatic: No anemia, purpura, petechiae. Allergic/Immunologic: No itchy/runny eyes, nasal congestion, recent allergic reactions, rashes  ALLERGIES: Allergies  Allergen Reactions  . Other Itching    Grass pollen  . Tramadol Other (See Comments)    Migraine     HOME MEDICATIONS:  Current Outpatient Medications:  .  acetaminophen (TYLENOL) 325 MG tablet, Take 650 mg by mouth every 6 (six) hours as needed for mild pain or fever., Disp: , Rfl:  .  ALPRAZolam (XANAX) 0.5 MG tablet, Take 0.5 mg by mouth 2 (two) times daily as needed for anxiety., Disp: , Rfl:  .   clobetasol ointment (TEMOVATE) 0.05 %, Apply 1 application topically in the morning, at noon, and at bedtime., Disp: , Rfl:  .  Diroximel Fumarate (VUMERITY) 231 MG CPDR, Take 462 mg by mouth in the morning and at bedtime., Disp: , Rfl:  .  ibuprofen (ADVIL,MOTRIN) 200 MG tablet, Take 200 mg by mouth every 6 (six) hours as needed for fever or mild pain., Disp: , Rfl:  .  Insulin Aspart, w/Niacinamide, (FIASP [place]), Inject 5 Units into the skin with breakfast, with lunch, and with evening meal., Disp: , Rfl:  .  Insulin Glargine-Lixisenatide (SOLIQUA) 100-33 UNT-MCG/ML SOPN, Inject 25 Units into the skin in the morning., Disp: , Rfl:  .  metFORMIN (GLUCOPHAGE) 1000 MG tablet, Take 0.5 tablets (500 mg total) by mouth 2 (two) times daily., Disp: 60 tablet, Rfl: 0 .  sertraline (ZOLOFT) 100 MG tablet, Take 100 mg by mouth daily., Disp: , Rfl:  .  Vitamin D, Ergocalciferol, (DRISDOL) 1.25 MG (50000 UNIT) CAPS capsule, Take 1 capsule (50,000 Units total) by mouth every 7 (seven) days., Disp: 13 capsule, Rfl: 3 .  rOPINIRole (REQUIP) 0.5 MG tablet, Take 1 tablet (0.5 mg total) by mouth 3 (three) times daily., Disp: 60 tablet, Rfl: 5  PAST MEDICAL HISTORY: Past Medical History:  Diagnosis Date  . Diabetes mellitus with hyperglycemia (HCC)   . Dizziness   . Gestational diabetes   . Joint pain   . Obesity   . Pregnancy induced hypertension   . Pregnant   . Psoriasis arthropathica (HCC)     PAST SURGICAL HISTORY: Past Surgical History:  Procedure Laterality Date  . CESAREAN SECTION  2004    FAMILY HISTORY: Family History  Problem Relation Age of Onset  . Diabetes Father   . Hypertension Father   . Stroke Father   . High Cholesterol Father   . Cancer Paternal Grandmother   . Kidney disease Mother     SOCIAL HISTORY:  Social History   Socioeconomic History  . Marital status: Single    Spouse name: Not on file  . Number of children: Not on file  . Years of education: Not on file  .  Highest education level: Not on file  Occupational History  . Not on file  Tobacco Use  . Smoking status: Former Smoker    Quit date: 2004    Years since quitting: 17.9  . Smokeless tobacco: Never Used  Substance and Sexual Activity  . Alcohol use: No    Comment: quit 2004  . Drug use: No  . Sexual activity: Yes  Other Topics Concern  . Not on file  Social History Narrative   Caffeine use: soda daily   Left handed    Social Determinants of Health   Financial Resource Strain:   . Difficulty of Paying Living Expenses: Not on file  Food Insecurity:   . Worried About Programme researcher, broadcasting/film/video in the Last Year: Not on file  . Ran Out of Food in the Last Year: Not on file  Transportation Needs:   . Lack of Transportation (Medical): Not on file  . Lack of Transportation (Non-Medical): Not on file  Physical Activity:   . Days of Exercise per Week: Not on file  . Minutes of Exercise per Session: Not on file  Stress:   . Feeling of Stress : Not on file  Social Connections:   . Frequency of Communication with Friends and Family: Not on file  . Frequency of Social Gatherings with Friends and Family: Not on file  . Attends Religious Services: Not on file  . Active Member of Clubs or Organizations: Not on file  . Attends Banker Meetings: Not on file  . Marital Status: Not on file  Intimate Partner Violence:   . Fear of Current or Ex-Partner: Not on file  . Emotionally Abused: Not on file  . Physically Abused: Not on file  . Sexually Abused: Not on file     PHYSICAL EXAM  Vitals:   01/31/20 0842  BP: 136/81  Pulse: 96  Weight: 262 lb (118.8 kg)  Height: 5' 1.75" (1.568 m)    Body mass index is 48.31 kg/m.  No exam data present   General: The patient is well-developed and well-nourished and in no acute distress  HEENT:  Head is [place]/AT.  Sclera are anicteric.  Funduscopic exam shows normal optic discs and retinal vessels.  Neck: No carotid bruits  are noted.   The neck is nontender.  Cardiovascular: The heart has a regular rate and rhythm with a normal S1 and S2. There were no murmurs, gallops or rubs.    Skin: Extremities are without rash or  edema.  Musculoskeletal:  Back is nontender  Neurologic Exam  Mental status: The patient is alert and oriented x 3 at the time of the examination. The patient has apparent normal recent and remote memory, with an apparently normal attention span and concentration ability.   Speech is normal.  Cranial nerves: Extraocular movements are full. Pupils are equal, round, and reactive to light and accomodation.  Visual fields are full.  Facial symmetry is present.  She reports slightly reduced facial sensation to touch on the left and some allodynia in the right scalp.  Facial strength is normal.  Trapezius and sternocleidomastoid strength is normal. No dysarthria is noted.  The tongue is midline, and the patient has symmetric elevation of the soft palate. No obvious hearing deficits are noted.  Motor:  Muscle bulk is normal.   Tone is normal. Strength is  5 / 5 in all 4 extremities.   Sensory: Sensory testing is intact to pinprick, soft touch and vibration sensation in all 4 extremities.  Coordination: Cerebellar testing reveals good finger-nose-finger and heel-to-shin bilaterally.  Gait and station: Station is normal.   Gait is normal. Tandem gait is wide. Romberg is negative.   Reflexes: Deep tendon reflexes are symmetric and normal bilaterally.   Plantar responses are flexor.    DIAGNOSTIC DATA (LABS, IMAGING, TESTING) - I reviewed patient records, labs, notes, testing and imaging myself where available.  Lab Results  Component Value Date   WBC 10.2 07/22/2019   HGB 17.0 (H) 07/22/2019   HCT 50.1 (H) 07/22/2019   MCV 87 07/22/2019   PLT 252 07/22/2019      Component Value Date/Time   NA 130 (L) 09/20/2017 2000   K 3.5 09/20/2017 2000   CL 98 09/20/2017 2000   CO2 21 (L) 09/20/2017 2000    GLUCOSE 336 (H) 09/20/2017 2000   BUN 10 09/20/2017 2000   CREATININE 0.68 09/20/2017 2000   CREATININE 0.62 09/05/2014 1051   CALCIUM 7.9 (L) 09/20/2017 2000   PROT 6.9 07/22/2019 1027   ALBUMIN 4.1 07/22/2019 1027   AST 12 07/22/2019 1027   ALT 16 07/22/2019 1027   ALKPHOS 98 07/22/2019 1027   BILITOT 0.5 07/22/2019 1027   GFRNONAA >60 09/20/2017 2000   GFRAA >60 09/20/2017 2000   No results found for: CHOL, HDL, LDLCALC, LDLDIRECT, TRIG, CHOLHDL Lab Results  Component Value Date   HGBA1C 6.4 03/21/2014   No results found for: VITAMINB12 No results found for: TSH     ASSESSMENT AND PLAN  Multiple sclerosis (HCC) - Plan: CBC with Differential/Platelet, Hepatic function panel, MR BRAIN W WO CONTRAST  High risk medication use - Plan: CBC with Differential/Platelet, Hepatic function panel  Type 2 diabetes mellitus without complication, with long-term current use of insulin (HCC) - Plan: CBC with Differential/Platelet, Hepatic function panel  Herniated nucleus pulposus, C6-7  Numbness  Restless leg syndrome - Plan: Ferritin   1.  Continue Vumerity.  We will need to check CBC with differential, liver function test. 2.   She has insomnia likely related to restless leg syndrome/periodic limb movements of sleep.  I will have her start ropinirole.  We will also check ferritin to see if she is deficient in iron. 3.   Stay active and exercise  as tolerated. 4.   We discussed that she will likely need to have surgery in the neck at some point due to the disc herniation.  If this continues to bother her she should get back in touch with neurosurgery.  5.   Return in 6 months or sooner if there are new or worsening neurologic symptoms.  45-minute office visit with the majority of the time spent face-to-face for history and physical, discussion/counseling and decision-making.  Additional time with record review and documentation.  Maximino Cozzolino A. Epimenio Foot, MD, [facility] 01/31/2020, 3:17  PM Certified in Neurology, Clinical Neurophysiology, Sleep Medicine and Neuroimaging  [facility] [address] [place], [place] [address] [phone]

## 2020-02-01 ENCOUNTER — Telehealth: Payer: Self-pay | Admitting: *Deleted

## 2020-02-01 ENCOUNTER — Telehealth: Payer: Self-pay | Admitting: Neurology

## 2020-02-01 LAB — CBC WITH DIFFERENTIAL/PLATELET
Basophils Absolute: 0 10*3/uL (ref 0.0–0.2)
Basos: 1 %
EOS (ABSOLUTE): 0.1 10*3/uL (ref 0.0–0.4)
Eos: 2 %
Hematocrit: 39.9 % (ref 34.0–46.6)
Hemoglobin: 13.2 g/dL (ref 11.1–15.9)
Immature Grans (Abs): 0 10*3/uL (ref 0.0–0.1)
Immature Granulocytes: 1 %
Lymphocytes Absolute: 1.4 10*3/uL (ref 0.7–3.1)
Lymphs: 23 %
MCH: 28.8 pg (ref 26.6–33.0)
MCHC: 33.1 g/dL (ref 31.5–35.7)
MCV: 87 fL (ref 79–97)
Monocytes Absolute: 0.5 10*3/uL (ref 0.1–0.9)
Monocytes: 9 %
Neutrophils Absolute: 3.8 10*3/uL (ref 1.4–7.0)
Neutrophils: 64 %
Platelets: 275 10*3/uL (ref 150–450)
RBC: 4.59 x10E6/uL (ref 3.77–5.28)
RDW: 13.8 % (ref 11.7–15.4)
WBC: 6 10*3/uL (ref 3.4–10.8)

## 2020-02-01 LAB — HEPATIC FUNCTION PANEL
ALT: 15 IU/L (ref 0–32)
AST: 13 IU/L (ref 0–40)
Albumin: 4 g/dL (ref 3.8–4.8)
Alkaline Phosphatase: 93 IU/L (ref 44–121)
Bilirubin Total: 0.4 mg/dL (ref 0.0–1.2)
Bilirubin, Direct: 0.1 mg/dL (ref 0.00–0.40)
Total Protein: 6.9 g/dL (ref 6.0–8.5)

## 2020-02-01 LAB — FERRITIN: Ferritin: 45 ng/mL (ref 15–150)

## 2020-02-01 NOTE — Telephone Encounter (Signed)
-----   Message from Asa Lente, MD sent at 02/01/2020 10:32 AM EST ----- Please let the patient know that the lab work is fine.

## 2020-02-01 NOTE — Telephone Encounter (Signed)
Called and spoke with pt about lab results per Dr. Sater note. Pt verbalized understanding.  

## 2020-02-01 NOTE — Telephone Encounter (Signed)
[employer] auth: NPR spoke to Melody Ref # [id number] order sent to [facility]. They will reach out to the patient to schedule.

## 2020-02-17 ENCOUNTER — Ambulatory Visit
Admission: RE | Admit: 2020-02-17 | Discharge: 2020-02-17 | Disposition: A | Discharge status: Home / Self Care | Payer: BC Managed Care – PPO | Source: Ambulatory Visit | Attending: Neurology | Admitting: Neurology

## 2020-02-17 ENCOUNTER — Other Ambulatory Visit: Payer: Self-pay

## 2020-02-17 DIAGNOSIS — G35 Multiple sclerosis: Secondary | ICD-10-CM

## 2020-02-17 DIAGNOSIS — G35D Multiple sclerosis, unspecified: Secondary | ICD-10-CM

## 2020-02-17 MED ORDER — GADOBENATE DIMEGLUMINE 529 MG/ML IV SOLN
20.0000 mL | Freq: Once | INTRAVENOUS | Status: AC | PRN
  Administered 2020-02-17: 20 mL via INTRAVENOUS

## 2020-07-15 ENCOUNTER — Other Ambulatory Visit: Payer: Self-pay | Admitting: Neurology

## 2020-07-31 ENCOUNTER — Ambulatory Visit: Payer: BC Managed Care – PPO | Admitting: Neurology

## 2020-08-17 ENCOUNTER — Telehealth: Payer: Self-pay | Admitting: Neurology

## 2020-08-17 NOTE — Telephone Encounter (Signed)
Submitted PA on CMM. Key: [id number]. Waiting on determination.

## 2020-08-17 NOTE — Telephone Encounter (Signed)
Pt states she has been on VUMERITY 231 MG CPDR for years, she is now being told by her insurance that the medication is not covered.  Pt asking if a PA can be put in, she says she will be out of the medication in about 10 days. Please call

## 2020-08-17 NOTE — Telephone Encounter (Signed)
Called pt back. No insurance changes since last year. She has about 10 days left of med. Advised we will submit urgent PA for her. I will call her back once determination is made. Also advised she can reach out to [employer] to see if they can supply temporary medication so she does not run out. She verbalized understanding and appreciation.

## 2020-08-17 NOTE — Telephone Encounter (Signed)
PA approved effective from 08/17/2020 through 08/16/2021. I called pt to let her know. She verbalized understanding.

## 2020-09-15 ENCOUNTER — Other Ambulatory Visit: Payer: Self-pay | Admitting: Neurology

## 2020-10-24 ENCOUNTER — Other Ambulatory Visit: Payer: Self-pay | Admitting: Neurology

## 2020-11-06 ENCOUNTER — Encounter: Payer: Self-pay | Admitting: Neurology

## 2020-11-06 ENCOUNTER — Ambulatory Visit (INDEPENDENT_AMBULATORY_CARE_PROVIDER_SITE_OTHER): Payer: BC Managed Care – PPO | Admitting: Neurology

## 2020-11-06 VITALS — BP 148/96 | HR 99 | Ht 61.0 in | Wt 258.5 lb

## 2020-11-06 DIAGNOSIS — G35 Multiple sclerosis: Secondary | ICD-10-CM

## 2020-11-06 DIAGNOSIS — N3941 Urge incontinence: Secondary | ICD-10-CM

## 2020-11-06 DIAGNOSIS — Z794 Long term (current) use of insulin: Secondary | ICD-10-CM

## 2020-11-06 DIAGNOSIS — M50223 Other cervical disc displacement at C6-C7 level: Secondary | ICD-10-CM

## 2020-11-06 DIAGNOSIS — G2581 Restless legs syndrome: Secondary | ICD-10-CM | POA: Diagnosis not present

## 2020-11-06 DIAGNOSIS — Z79899 Other long term (current) drug therapy: Secondary | ICD-10-CM | POA: Diagnosis not present

## 2020-11-06 DIAGNOSIS — R200 Anesthesia of skin: Secondary | ICD-10-CM

## 2020-11-06 DIAGNOSIS — E119 Type 2 diabetes mellitus without complications: Secondary | ICD-10-CM

## 2020-11-06 DIAGNOSIS — M542 Cervicalgia: Secondary | ICD-10-CM

## 2020-11-06 MED ORDER — VITAMIN D (ERGOCALCIFEROL) 1.25 MG (50000 UNIT) PO CAPS: 50000.0000 [IU] | ORAL_CAPSULE | ORAL | 1 refills | Status: DC

## 2020-11-06 NOTE — Progress Notes (Signed)
[facility]  PATIENT: Morgan Allen DOB: [birth year]  REFERRING DOCTOR OR PCP:  Kirstie Peri SOURCE: Patient, notes from PCP.  _________________________________   HISTORICAL  CHIEF COMPLAINT:  Chief Complaint  Patient presents with   Follow-up    Room 1. Pt is alone.     HISTORY OF PRESENT ILLNESS:  She is a 40 y.o. 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   She tolerates it fairly well.      She notes more fatigue the last week.   Of note, Abilify was added last week 10 mg.    She switched the Zoloft to Lexapro as well.   When irritated she feels rage quickly.    She also has had crying speells.    She has insomnia and RLS.   Requip 1 mg had not helped much.    We discussed taking 3 x 0.5 = 1.5 mg at night.    She has insomnia, sleep maintenance > sleep onset.  She snores mildly but has not had OSA signs (husband reportedly has never noted).     Gait, strength and sensation are stable.  She is mildly off balance but has no falls.  Bladder function is fine.  MRI 02/17/2020 showed  T2/flair hyperintense foci in the hemispheres and left pons in a pattern and configuration consistent with chronic demyelinating plaque associated with multiple sclerosis.  None of the foci enhance or appear to be acute.  Compared to the MRI dated 08/11/2019, there are no new lesions.   Normal enhancement pattern and no acute findings.  Her vitamin D was also low.  She is on supplements.  She has psoriasis.   Her grandmother had a poor gait and other neurologic issues but never got a diagnosis.   She died around 58 with cancer.     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   An ESI did not help.   In the past, gabapentin caused mental fogginess. Surgery has been discussed (Dr. Conchita Paris) and she prefers to wait.       MS History:  In June 2018, she was slurring her words and was ataxic like she was drunk.  She  was taken to [facility] -  ED and a CT scan was abnormal. She then had an MRI worrisome for MS.   She had 2 lumbar punctures and they were apparently inconclusive.   She was seen by neurosurgery and a brain biopsy was discussed but she declined.   She was infused with steroids and improved.   In 2019, she was slurring words again and her face was drooping.  She went to [facility] ED and MRIs show lesions c/w MS (periventricular and juxtacortical)  IMAGING: MRI 02/17/2020 showed  T2/flair hyperintense foci in the hemispheres and left pons in a pattern and configuration consistent with chronic demyelinating plaque associated with multiple sclerosis.  None of the foci enhance or appear to be acute.  Compared to the MRI dated 08/11/2019, there are no new lesions.   Normal enhancement pattern and no acute findings.  MRI of the brain 07/2019 showed additional brain foci including some enhancing lesions ncy, worse if sugars high.     MRI cervical spine 08/11/2019 shows T2 hyperintense focus within the spinal cord anteriorly adjacent to C5.  Though nonspecific, this is consistent with demyelination from multiple sclerosis.  It is unchanged compared to the 2019 MRI.  Disc extrusion at C6-C7 with caudal extension causing moderate spinal stenosis.  It appears unchanged compared to the 2019 MRI.  MRI of the thoracic spine 10/28/2018 abnormal signal adjacent to the T10 and T11 vertebral bodies and some mild marrow edema at T10-T11 that was further evaluated with a CT scan that did not show any evidence of osteomyelitis  The MRI of the brain 09/02/2017 shows multiple T2/FLAIR hyperintense foci, some of the periventricular and juxtacortical white matter.  There is a subtle focus that may also be in the posterior inferior right pons.  None of the foci enhance.    The MRI of the cervical spine 09/21/2017 shows a T2 hyperintense focus adjacent to C5, anteriorly.  Additionally, there is a large herniated disc arising from C6-C7  with caudal migration causing significant spinal stenosis and indentation of the spinal cord.  There is no myelopathic signal.     REVIEW OF SYSTEMS: Constitutional: No fevers, chills, sweats, or change in appetite.  She notes fatigue and heat intolerance.  She has an irregular sleep schedule and insomnia. Eyes: No visual changes, double vision, eye pain Ear, nose and throat: No hearing loss, ear pain, nasal congestion, sore throat Cardiovascular: No chest pain, palpitations Respiratory:  No shortness of breath at rest or with exertion.   No wheezes GastrointestinaI: No nausea, vomiting, diarrhea, abdominal pain, fecal incontinence Genitourinary:  No dysuria, urinary retention or frequency.  No nocturia. Musculoskeletal:  No neck pain, back pain Integumentary: No rash, pruritus, skin lesions.  She has psoriasis.  It is mild. Neurological: as above Psychiatric: No depression at this time.  No anxiety Endocrine: No palpitations, diaphoresis, change in appetite, change in weigh or increased thirst Hematologic/Lymphatic:  No anemia, purpura, petechiae. Allergic/Immunologic: No itchy/runny eyes, nasal congestion, recent allergic reactions, rashes  ALLERGIES: Allergies  Allergen Reactions   Other Itching    Grass pollen   Tramadol Other (See Comments)    Migraine     HOME MEDICATIONS:  Current Outpatient Medications:    acetaminophen (TYLENOL) 325 MG tablet, Take 650 mg by mouth every 6 (six) hours as needed for mild pain or fever., Disp: , Rfl:    ALPRAZolam (XANAX) 0.5 MG tablet, Take 0.5 mg by mouth 2 (two) times daily as needed for anxiety., Disp: , Rfl:    clobetasol ointment (TEMOVATE) 0.05 %, Apply 1 application topically in the morning, at noon, and at bedtime., Disp: , Rfl:    ibuprofen (ADVIL,MOTRIN) 200 MG tablet, Take 200 mg by mouth every 6 (six) hours as needed for fever or mild pain., Disp: , Rfl:    Insulin Aspart, w/Niacinamide, (FIASP [place]), Inject 5 Units into the skin  with breakfast, with lunch, and with evening meal., Disp: , Rfl:    Insulin Glargine-Lixisenatide (SOLIQUA) 100-33 UNT-MCG/ML SOPN, Inject 25 Units into the skin in the morning., Disp: , Rfl:    metFORMIN (GLUCOPHAGE) 1000 MG tablet, Take 0.5 tablets (500 mg total) by mouth 2 (two) times daily., Disp: 60 tablet, Rfl: 0   rOPINIRole (REQUIP) 0.5 MG tablet, Take 1 tablet (0.5 mg total) by mouth 3 (three) times daily., Disp: 60 tablet, Rfl: 5   sertraline (ZOLOFT) 100 MG tablet, Take 100 mg by mouth daily., Disp: , Rfl:    Vitamin D, Ergocalciferol, (DRISDOL) 1.25 MG (50000 UNIT) CAPS capsule, TAKE 1 CAPSULE BY MOUTH EVERY 7 DAYS., Disp: 2 capsule, Rfl: 0   VUMERITY 231 MG CPDR, TAKE 2 CAPSULES (462 MG) TWICE A DAY, Disp: 120 capsule, Rfl: 11  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HCC)     PAST SURGICAL HISTORY: Past Surgical History:  Procedure Laterality Date   CESAREAN SECTION  2004    FAMILY HISTORY: Family History  Problem Relation Age of Onset   Diabetes Father    Hypertension Father    Stroke Father    High Cholesterol Father    Cancer Paternal Grandmother    Kidney disease Mother     SOCIAL HISTORY:  Social History   Socioeconomic History   Marital status: Single    Spouse name: Not on file   Number of children: Not on file   Years of education: Not on file   Highest education level: Not on file  Occupational History   Not on file  Tobacco Use   Smoking status: Former    Types: Cigarettes    Quit date: 2004    Years since quitting: 18.7   Smokeless tobacco: Never  Substance and Sexual Activity   Alcohol use: No    Comment: quit 2004   Drug use: No   Sexual activity: Yes  Other Topics Concern   Not on file  Social History Narrative   Caffeine use:  soda daily   Left handed    Social Determinants of Health   Financial Resource Strain: Not on file  Food Insecurity: Not on file  Transportation Needs: Not on file  Physical Activity: Not on file  Stress: Not on file  Social Connections: Not on file  Intimate Partner Violence: Not on file     PHYSICAL EXAM  Vitals:   11/06/20 1253  Weight: 258 lb 8 oz (117.3 kg)  Height: 5\' 1"  (1.549 m)    Body mass index is 48.84 kg/m.  No results found.   General: The patient is well-developed and well-nourished and in no acute distress  HEENT:  Head is [place]/AT.  Sclera are anicteric.  Funduscopic exam shows normal optic discs and retinal vessels.  Neck: No carotid bruits are noted.  The neck is nontender.  Cardiovascular: The heart has a regular rate and rhythm with a normal S1 and S2. There were no murmurs, gallops or rubs.    Skin: Extremities are without rash or  edema.  Musculoskeletal:  Back is nontender  Neurologic Exam  Mental status: The patient is alert and oriented x 3 at the time of the examination. The patient has apparent normal recent and remote memory, with an apparently normal attention span and concentration ability.   Speech is normal.  Cranial nerves: Extraocular movements are full. Pupils are equal, round, and reactive to light and accomodation.  Visual fields are full.  Facial symmetry is present.  She reports slightly reduced facial sensation to touch on the left and some allodynia in the right scalp.  Facial strength is normal.  Trapezius and sternocleidomastoid strength is normal. No dysarthria is noted.  The tongue is midline, and the patient has symmetric elevation of the soft palate. No obvious hearing deficits are noted.  Motor:  Muscle bulk is normal.   Tone is normal. Strength is  5 / 5 in all 4 extremities.   Sensory: Sensory testing is intact to pinprick, soft touch and vibration sensation in all 4 extremities.  Coordination: Cerebellar testing  reveals good finger-nose-finger and heel-to-shin bilaterally.  Gait and station: Station is normal.   Gait is normal. Tandem gait is wide. Romberg is negative.   Reflexes: Deep tendon reflexes are symmetric and normal bilaterally.   Plantar responses are flexor.    DIAGNOSTIC DATA (LABS, IMAGING, TESTING) - I reviewed patient records, labs, notes, testing and imaging myself where available.  Lab Results  Component Value Date   WBC 6.0 01/31/2020   HGB 13.2 01/31/2020   HCT 39.9 01/31/2020   MCV 87 01/31/2020   PLT 275 01/31/2020      Component Value Date/Time   NA 130 (L) 09/20/2017 2000   K 3.5 09/20/2017 2000   CL 98 09/20/2017 2000   CO2 21 (L) 09/20/2017 2000   GLUCOSE 336 (H) 09/20/2017 2000   BUN 10 09/20/2017 2000   CREATININE 0.68 09/20/2017 2000   CREATININE 0.62 09/05/2014 1051   CALCIUM 7.9 (L) 09/20/2017 2000   PROT 6.9 01/31/2020 0953   ALBUMIN 4.0 01/31/2020 0953   AST 13 01/31/2020 0953   ALT 15 01/31/2020 0953   ALKPHOS 93 01/31/2020 0953   BILITOT 0.4 01/31/2020 0953   GFRNONAA >60 09/20/2017 2000   GFRAA >60 09/20/2017 2000   No results found for: CHOL, HDL, LDLCALC, LDLDIRECT, TRIG, CHOLHDL Lab Results  Component Value Date   HGBA1C 6.4 03/21/2014   No results found for: VITAMINB12 No results found for: TSH     ASSESSMENT AND PLAN  No diagnosis found.   1.  Continue Vumerity.  Labs were fine 09/22/2020.   We will need to check another MRI around the end of the year to determine if any breakthrough activity.    2.   She has insomnia likely related to restless leg syndrome/periodic limb movements of sleep.  I will have her start ropinirole.  We will also check ferritin to see if she is deficient in iron. 3.   Stay active and exercise as tolerated. 4.   We discussed that she will likely need to have surgery in the neck at some point due to the disc herniation.  If this continues to bother her she should get back in touch with neurosurgery.  5.    Return in 6 months or sooner if there are new or worsening neurologic symptoms.  45-minute office visit with the majority of the time spent face-to-face for history and physical, discussion/counseling and decision-making.  Additional time with record review and documentation.  Jozlynn Plaia A. Epimenio Foot, MD, [facility] 11/06/2020, 12:57 PM Certified in Neurology, Clinical Neurophysiology, Sleep Medicine and Neuroimaging  [facility] [address] [place], [place] [address] [phone]

## 2020-11-09 ENCOUNTER — Telehealth: Payer: Self-pay | Admitting: Neurology

## 2020-11-09 DIAGNOSIS — G35 Multiple sclerosis: Secondary | ICD-10-CM

## 2020-11-09 DIAGNOSIS — R200 Anesthesia of skin: Secondary | ICD-10-CM

## 2020-11-09 NOTE — Telephone Encounter (Signed)
Patient left a voicemail on my phone stating that she would like an open MRI machine. I faxed the order to [facility] they will reach out to the patient to schedule.

## 2021-02-14 ENCOUNTER — Other Ambulatory Visit: Payer: Self-pay | Admitting: Neurology

## 2021-02-14 NOTE — Telephone Encounter (Signed)
I placed order for Dr. Lucia Gaskins, thank you!

## 2021-02-14 NOTE — Addendum Note (Signed)
Addended by: Arther Abbott on: 02/14/2021 01:05 PM   Modules accepted: Orders

## 2021-02-14 NOTE — Telephone Encounter (Signed)
Noted, faxed the up to date order to [facility].

## 2021-02-14 NOTE — Telephone Encounter (Signed)
Carollee Herter with [facility] called stating that the patient is scheduled to have her MRI done at [facility] for Friday. But she is [redacted] weeks pregnant. Do you want to change the order to a Brain without contrast?

## 2021-05-07 ENCOUNTER — Other Ambulatory Visit: Payer: Self-pay

## 2021-05-07 ENCOUNTER — Encounter: Payer: Self-pay | Admitting: Family Medicine

## 2021-05-07 ENCOUNTER — Ambulatory Visit (INDEPENDENT_AMBULATORY_CARE_PROVIDER_SITE_OTHER): Payer: BC Managed Care – PPO | Admitting: Family Medicine

## 2021-05-07 VITALS — BP 170/92 | HR 108 | Ht 61.0 in | Wt 274.0 lb

## 2021-05-07 DIAGNOSIS — G35 Multiple sclerosis: Secondary | ICD-10-CM

## 2021-05-07 DIAGNOSIS — O0990 Supervision of high risk pregnancy, unspecified, unspecified trimester: Secondary | ICD-10-CM

## 2021-05-07 DIAGNOSIS — G2581 Restless legs syndrome: Secondary | ICD-10-CM

## 2021-05-07 DIAGNOSIS — E559 Vitamin D deficiency, unspecified: Secondary | ICD-10-CM

## 2021-05-07 DIAGNOSIS — Z79899 Other long term (current) drug therapy: Secondary | ICD-10-CM

## 2021-05-07 NOTE — Progress Notes (Addendum)
Chief Complaint  Patient presents with   Follow-up    Rm 11-pt alone. Overall doing well. Pt is 6 months pregnant. She hasnt been able to complete the MRI due to she requires xanax and with her being pregnant she Is unable to do MRI without.     HISTORY OF PRESENT ILLNESS:  05/07/21 ALL:  Morgan Allen is a 41 y.o. female here today for follow up for RRMS. She continues Vumerity and tolerating well. Labs have been stable. MRI 01/2020 was stable. He ordered repeat imaging 10/2020 and 01/2021 that has not been performed. She is 6 months pregnant and expected to deliver 08/18/2021. She has continued DMT. She discussed with OB and was advised that most worrisome timing was first trimester. She preferred to continue Vumerity. Labs stable 01/2021. She is followed by high risk OB every at [facility] 2-4 weeks.   Mood is stable on escitalopram and Abilify. Alprazolam 0.5mg  was discontinued during pregnancy. She is sleeping well. She is sleeping more than normal but feels it is r/t pregnancy. RLS is well managed. She discontinued ropinirole.   She reports BP is usually "good". She reports that home readings are 130's/70-80's. She has white coat syndrome. Readings always improve at home. Marcelline Deist was discontinued d/t pregnancy. Novalog/novalin used for glucose control. CBGs are usually 130s fasting.   HISTORY (copied from Dr Bonnita Hollow previous note)  She is a 41 y.o. 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   She tolerates it fairly well.       She notes more fatigue the last week.   Of note, Abilify was added last week 10 mg.    She switched the Zoloft to Lexapro as well.   When irritated she feels rage quickly.    She also has had crying speells.    She has insomnia and RLS.   Requip 1 mg had not helped much.    We discussed taking 3 x 0.5 = 1.5 mg at night.    She has insomnia, sleep maintenance > sleep onset.  She snores mildly but  has not had OSA signs (husband reportedly has never noted).      Gait, strength and sensation are stable.  She is mildly off balance but has no falls.  Bladder function is fine.   MRI 02/17/2020 showed  T2/flair hyperintense foci in the hemispheres and left pons in a pattern and configuration consistent with chronic demyelinating plaque associated with multiple sclerosis.  None of the foci enhance or appear to be acute.  Compared to the MRI dated 08/11/2019, there are no new lesions.   Normal enhancement pattern and no acute findings.   Her vitamin D was also low.  She is on supplements.   She has psoriasis.   Her grandmother had a poor gait and other neurologic issues but never got a diagnosis.   She died around 43 with cancer.      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   An ESI did not help.   In the past, gabapentin caused mental fogginess. Surgery has been discussed (Dr. Conchita Paris) and she prefers to wait.        MS History:  In June 2018, she was slurring her words and was ataxic like she was drunk.  She was taken to [facility] ED and a CT scan was abnormal. She then had an MRI worrisome for MS.  She had 2 lumbar punctures and they were apparently inconclusive.   She was seen by neurosurgery and a brain biopsy was discussed but she declined.   She was infused with steroids and improved.   In 2019, she was slurring words again and her face was drooping.  She went to [facility] ED and MRIs show lesions c/w MS (periventricular and juxtacortical)   IMAGING: MRI 02/17/2020 showed  T2/flair hyperintense foci in the hemispheres and left pons in a pattern and configuration consistent with chronic demyelinating plaque associated with multiple sclerosis.  None of the foci enhance or appear to be acute.  Compared to the MRI dated 08/11/2019, there are no new lesions.   Normal enhancement pattern and no acute findings.   MRI of the brain 07/2019 showed additional brain foci including some enhancing  lesions ncy, worse if sugars high.      MRI cervical spine 08/11/2019 shows T2 hyperintense focus within the spinal cord anteriorly adjacent to C5.  Though nonspecific, this is consistent with demyelination from multiple sclerosis.  It is unchanged compared to the 2019 MRI.   Disc extrusion at C6-C7 with caudal extension causing moderate spinal stenosis.  It appears unchanged compared to the 2019 MRI.   MRI of the thoracic spine 10/28/2018 abnormal signal adjacent to the T10 and T11 vertebral bodies and some mild marrow edema at T10-T11 that was further evaluated with a CT scan that did not show any evidence of osteomyelitis   The MRI of the brain 09/02/2017 shows multiple T2/FLAIR hyperintense foci, some of the periventricular and juxtacortical white matter.  There is a subtle focus that may also be in the posterior inferior right pons.  None of the foci enhance.     The MRI of the cervical spine 09/21/2017 shows a T2 hyperintense focus adjacent to C5, anteriorly.  Additionally, there is a large herniated disc arising from C6-C7 with caudal migration causing significant spinal stenosis and indentation of the spinal cord.  There is no myelopathic signal.   REVIEW OF SYSTEMS: Out of a complete 14 system review of symptoms, the patient complains only of the following symptoms, sleepiness, anxiety and all other reviewed systems are negative.   ALLERGIES: Allergies  Allergen Reactions   Other Itching    Grass pollen   Tramadol Other (See Comments)    Migraine      HOME MEDICATIONS: Outpatient Medications Prior to Visit  Medication Sig Dispense Refill   acetaminophen (TYLENOL) 325 MG tablet Take 650 mg by mouth every 6 (six) hours as needed for mild pain or fever.     ARIPiprazole (ABILIFY) 10 MG tablet Take 10 mg by mouth daily.     cephALEXin (KEFLEX) 500 MG capsule Take 500 mg by mouth daily.     escitalopram (LEXAPRO) 10 MG tablet Take 10 mg by mouth daily.     Insulin Aspart, w/Niacinamide,  (FIASP [place]) Inject 5 Units into the skin with breakfast, with lunch, and with evening meal.     metFORMIN (GLUCOPHAGE) 1000 MG tablet Take 0.5 tablets (500 mg total) by mouth 2 (two) times daily. 60 tablet 0   NIFEdipine (PROCARDIA-XL/NIFEDICAL-XL) 30 MG 24 hr tablet Take 30 mg by mouth daily.     Prenatal Vit-Fe Fumarate-FA (PRENATAL MULTIVITAMIN) TABS tablet Take 1 tablet by mouth daily at 12 noon.     VUMERITY 231 MG CPDR TAKE 2 CAPSULES (462 MG) TWICE A DAY 120 capsule 11   ALPRAZolam (XANAX) 0.5 MG tablet Take 0.5 mg by  mouth 2 (two) times daily as needed for anxiety.     clobetasol ointment (TEMOVATE) [id number] % Apply 1 application topically in the morning, at noon, and at bedtime.     FARXIGA 10 MG TABS tablet Take 10 mg by mouth daily.     ibuprofen (ADVIL,MOTRIN) 200 MG tablet Take 200 mg by mouth every 6 (six) hours as needed for fever or mild pain.     rOPINIRole (REQUIP) 0.5 MG tablet Take 1 tablet (0.5 mg total) by mouth 3 (three) times daily. 60 tablet 5   Vitamin D, Ergocalciferol, (DRISDOL) 1.25 MG (50000 UNIT) CAPS capsule Take 1 capsule (50,000 Units total) by mouth every 7 (seven) days. 13 capsule 1   No facility-administered medications prior to visit.     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[place])      PAST SURGICAL HISTORY: Past Surgical History:  Procedure Laterality Date   CESAREAN SECTION  2004     FAMILY HISTORY: Family History  Problem Relation Age of Onset   Diabetes Father    Hypertension Father    Stroke Father    High Cholesterol Father    Cancer Paternal Grandmother    Kidney disease Mother      SOCIAL HISTORY: Social History   Socioeconomic History   Marital status: Single    Spouse name: Not on file   Number of children: Not on file    Years of education: Not on file   Highest education level: Not on file  Occupational History   Not on file  Tobacco Use   Smoking status: Former    Types: Cigarettes    Quit date: 2004    Years since quitting: 19.2   Smokeless tobacco: Never  Substance and Sexual Activity   Alcohol use: No    Comment: quit 2004   Drug use: No   Sexual activity: Yes  Other Topics Concern   Not on file  Social History Narrative   Caffeine use: soda daily   Left handed    Social Determinants of Health   Financial Resource Strain: Not on file  Food Insecurity: Not on file  Transportation Needs: Not on file  Physical Activity: Not on file  Stress: Not on file  Social Connections: Not on file  Intimate Partner Violence: Not on file    PHYSICAL EXAM  Vitals:   05/07/21 1309  BP: (!) 170/92  Pulse: (!) 108  Weight: 274 lb (124.3 kg)  Height: 5\' 1"  (1.549 m)   Body mass index is 51.77 kg/m.  Generalized: Well developed, in no acute distress  Cardiology: normal rate and rhythm, no murmur auscultated  Respiratory: clear to auscultation bilaterally    Neurological examination  Mentation: Alert oriented to time, place, history taking. Follows all commands speech and language fluent Cranial nerve II-XII: Pupils were equal round reactive to light. Extraocular movements were full, visual field were full on confrontational test. Facial sensation and strength were normal. Head turning and shoulder shrug  were normal and symmetric. Motor: The motor testing reveals 5 over 5 strength of all 4 extremities. Good symmetric motor tone is noted throughout.  Sensory: Sensory testing is intact to soft touch on all 4 extremities. No evidence of extinction is noted.  Coordination: Cerebellar testing reveals good finger-nose-finger and heel-to-shin bilaterally.  Gait and station: Gait is normal.  Reflexes: Deep tendon reflexes are symmetric and normal bilaterally.    DIAGNOSTIC DATA (LABS, IMAGING,  TESTING) - I reviewed patient records, labs, notes, testing and imaging myself where available.  Lab Results  Component Value Date   WBC 6.0 01/31/2020   HGB 13.2 01/31/2020   HCT 39.9 01/31/2020   MCV 87 01/31/2020   PLT 275 01/31/2020      Component Value Date/Time   NA 130 (L) 09/20/2017 2000   K 3.5 09/20/2017 2000   CL 98 09/20/2017 2000   CO2 21 (L) 09/20/2017 2000   GLUCOSE 336 (H) 09/20/2017 2000   BUN 10 09/20/2017 2000   CREATININE 0.68 09/20/2017 2000   CREATININE 0.62 09/05/2014 1051   CALCIUM 7.9 (L) 09/20/2017 2000   PROT 6.9 01/31/2020 0953   ALBUMIN 4.0 01/31/2020 0953   AST 13 01/31/2020 0953   ALT 15 01/31/2020 0953   ALKPHOS 93 01/31/2020 0953   BILITOT 0.4 01/31/2020 0953   GFRNONAA >60 09/20/2017 2000   GFRAA >60 09/20/2017 2000   No results found for: CHOL, HDL, LDLCALC, LDLDIRECT, TRIG, CHOLHDL Lab Results  Component Value Date   HGBA1C 6.4 03/21/2014   No results found for: VITAMINB12 No results found for: TSH  No flowsheet data found.   No flowsheet data found.   ASSESSMENT AND PLAN  41 y.o. 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 here with    Multiple sclerosis ([place]) - Plan: CBC with Differential/Platelets, CMP  Vitamin D deficiency - Plan: Vitamin D, 25-hydroxy  High risk medication use  Restless leg syndrome  Evelise is doing well, today. She continues Vumerity. She is 6 months gestation. She is followed by high risk OB every 2-4 weeks. They are aware she is taking Vumerity and she is adamant that she continue d/t fears of relapse if she were to discontinue. She has had three previous miscarriage/still births prior to MS diagnosis. We have reviewed concerns of continuing DMT during pregnancy. She feels comfortable with OB discussion and wishes to  continue. We will update labs, today. She is not current taking vitamin D supplements. We will update imaging once she is allowed to take alprazolam d/t claustrophobia. Previously on alprazolam for anxiety through PCP. Mood is good on escitalopram and Abilify. She was encouraged to continue healthy lifestyle habits. She will monitor BP and CBGs closely at home. We have reviewed BP in office. She reports white coat syndrome. She will monitor closely at home. She has appt with OB this week. She will follow up with Dr Felecia Shelling in 6 months, sooner if needed.    Orders Placed This Encounter  Procedures   CBC with Differential/Platelets   CMP   Vitamin D, 25-hydroxy     No orders of the defined types were placed in this encounter.     Morgan Presto, MSN, FNP-C 05/07/2021, 1:46 PM  [facility] [address] [place], [place] [address] [phone]   I have read the note, and I agree with the clinical assessment and plan.  Richard A. Felecia Shelling, MD, PhD, [facility] Certified in Neurology, Clinical Neurophysiology, Sleep Medicine, Pain Medicine and Neuroimaging  [facility] [address], [place], [place] [address] [phone]

## 2021-05-07 NOTE — Patient Instructions (Addendum)
Below is our plan: ? ?We will continue Vumerity. Please consider calling pregnancy registry to document pregnancy on Vumerity. [phone]. Continue close follow up with OB. Monitor BP at home.  ? ?Please make sure you are staying well hydrated. I recommend 50-60 ounces daily. Well balanced diet and regular exercise encouraged. Consistent sleep schedule with 6-8 hours recommended.  ? ?Please continue follow up with care team as directed.  ? ?Follow up with Dr Epimenio Foot in 6 months  ? ?You may receive a survey regarding today's visit. I encourage you to leave honest feed back as I do use this information to improve patient care. Thank you for seeing me today!  ? ? ?

## 2021-05-08 LAB — CBC WITH DIFFERENTIAL/PLATELET
Basophils Absolute: 0 10*3/uL (ref 0.0–0.2)
Basos: 0 %
EOS (ABSOLUTE): 0.1 10*3/uL (ref 0.0–0.4)
Eos: 1 %
Hematocrit: 38.2 % (ref 34.0–46.6)
Hemoglobin: 12.8 g/dL (ref 11.1–15.9)
Immature Grans (Abs): 0.1 10*3/uL (ref 0.0–0.1)
Immature Granulocytes: 1 %
Lymphocytes Absolute: 1.2 10*3/uL (ref 0.7–3.1)
Lymphs: 14 %
MCH: 30 pg (ref 26.6–33.0)
MCHC: 33.5 g/dL (ref 31.5–35.7)
MCV: 90 fL (ref 79–97)
Monocytes Absolute: 0.5 10*3/uL (ref 0.1–0.9)
Monocytes: 6 %
Neutrophils Absolute: 6.5 10*3/uL (ref 1.4–7.0)
Neutrophils: 78 %
Platelets: 237 10*3/uL (ref 150–450)
RBC: 4.27 x10E6/uL (ref 3.77–5.28)
RDW: 13.9 % (ref 11.7–15.4)
WBC: 8.3 10*3/uL (ref 3.4–10.8)

## 2021-05-08 LAB — COMPREHENSIVE METABOLIC PANEL
ALT: 14 IU/L (ref 0–32)
AST: 12 IU/L (ref 0–40)
Albumin/Globulin Ratio: 1.3 (ref 1.2–2.2)
Albumin: 3.3 g/dL — ABNORMAL LOW (ref 3.8–4.8)
Alkaline Phosphatase: 72 IU/L (ref 44–121)
BUN/Creatinine Ratio: 18 (ref 9–23)
BUN: 9 mg/dL (ref 6–24)
Bilirubin Total: 0.4 mg/dL (ref 0.0–1.2)
CO2: 20 mmol/L (ref 20–29)
Calcium: 8.9 mg/dL (ref 8.7–10.2)
Chloride: 97 mmol/L (ref 96–106)
Creatinine, Ser: 0.51 mg/dL — ABNORMAL LOW (ref 0.57–1.00)
Globulin, Total: 2.6 g/dL (ref 1.5–4.5)
Glucose: 304 mg/dL — ABNORMAL HIGH (ref 70–99)
Potassium: 3.8 mmol/L (ref 3.5–5.2)
Sodium: 139 mmol/L (ref 134–144)
Total Protein: 5.9 g/dL — ABNORMAL LOW (ref 6.0–8.5)
eGFR: 120 mL/min/{1.73_m2} (ref >59)

## 2021-05-08 LAB — VITAMIN D 25 HYDROXY (VIT D DEFICIENCY, FRACTURES): Vit D, 25-Hydroxy: 22.8 ng/mL — ABNORMAL LOW (ref 30.0–100.0)

## 2021-05-09 ENCOUNTER — Ambulatory Visit: Payer: BC Managed Care – PPO | Admitting: Family Medicine

## 2021-05-09 ENCOUNTER — Telehealth: Payer: Self-pay | Admitting: Neurology

## 2021-05-09 NOTE — Telephone Encounter (Signed)
Called the pt to review the lab results. There was no answer. LVM advising the pt to call back.  ?

## 2021-05-09 NOTE — Telephone Encounter (Signed)
-----   Message from Shawnie Dapper, NP sent at 05/08/2021  4:21 PM EDT ----- ?Can you please let her know that her labs look ok with a couple of exceptions. She had an elevated glucose level. She may not have been fasting but I want her to keep a close eye on this with OB. Her vitamin D was a little low. I would like for her to start a supplement. My recommendation would be 1000iu daily if ok with OB. Have her discuss with OB and if cleared, she can pick up supplement at any pharmacy. Rx vitamin D hasn't been well studied in pregnancy and may be too much for her. We will see her back with Dr Epimenio Foot in 6 months.  ?

## 2021-05-10 NOTE — Telephone Encounter (Signed)
Called a 2nd time and there was no answer. LVM advising pt to call back. ?

## 2021-05-10 NOTE — Telephone Encounter (Signed)
Pt returned call and I was able to review the results of the lab work with her. Advised that Amy would recommend with the low vitamind d level to discuss with her OB to see if they agree an otc vitamin d supplement may be helpful. Pt verbalized understanding. ?Pt had no questions at this time but was encouraged to call back if questions arise. ? ?

## 2021-07-06 ENCOUNTER — Other Ambulatory Visit: Payer: Self-pay | Admitting: Neurology

## 2021-07-18 ENCOUNTER — Other Ambulatory Visit: Payer: Self-pay | Admitting: *Deleted

## 2021-07-18 DIAGNOSIS — G35 Multiple sclerosis: Secondary | ICD-10-CM

## 2021-07-18 MED ORDER — VUMERITY 231 MG PO CPDR: DELAYED_RELEASE_CAPSULE | ORAL | 11 refills | Status: DC

## 2021-08-07 ENCOUNTER — Telehealth: Payer: Self-pay | Admitting: *Deleted

## 2021-08-07 NOTE — Telephone Encounter (Addendum)
LVM for pt to call office.  We received PA request on CMM for her Vumerity. Key: [id number]. However, no eligibility could be found. Wanted to confirm her insurance has not changed and make sure address on file is correct. Please verify insurance/address when she calls, thank you.

## 2021-08-08 NOTE — Telephone Encounter (Addendum)
Called pt at [phone] again. Had to LVM for her to call back.   Called Jason/spouse at [phone]. Had to LVM for him as well.

## 2021-08-08 NOTE — Telephone Encounter (Signed)
Pt returned call. Please call back when available. 

## 2021-08-08 NOTE — Telephone Encounter (Signed)
Called pt back. She states she now has [facility]. They were able to process claim for Vumerity. Nothing further needed. She will try to get copy of new insurance cards to our office. She will make sure to bring to f/u in September.

## 2021-10-23 NOTE — Telephone Encounter (Signed)
PA Vumerity submitted on CMM. Key: [id number]. Waiting on determination.

## 2021-10-23 NOTE — Telephone Encounter (Signed)
Morgan Allen from [facility] called stating that a PA is needed for her Vumerity. Pt has new insurance. [employer] Bin: [id number] ID: [id number] Group#: [id number]

## 2021-10-25 NOTE — Telephone Encounter (Signed)
Tried calling pt at [phone]. Went straight to automated message that call could not be completed and try again later. Tried Jason# on [place]: [phone]. Got busy signal. Also tried [phone].    Vumerity denied, we are trying to appeal via insurance but need pt to sign consent form needed from insurance for [place] to appeal. Wanting to know if we can email to her to have her email back. Need to know best email address.  Pt not active on mychart.

## 2021-11-06 ENCOUNTER — Telehealth: Payer: Self-pay | Admitting: Family Medicine

## 2021-11-06 ENCOUNTER — Other Ambulatory Visit: Payer: Self-pay | Admitting: *Deleted

## 2021-11-06 DIAGNOSIS — G35 Multiple sclerosis: Secondary | ICD-10-CM

## 2021-11-06 MED ORDER — VUMERITY 231 MG PO CPDR: DELAYED_RELEASE_CAPSULE | ORAL | 11 refills | Status: DC

## 2021-11-06 NOTE — Telephone Encounter (Signed)
Refill sent for the pt 

## 2021-11-06 NOTE — Telephone Encounter (Signed)
Emille from [facility] [facility] called stating that they are needing a new Rx for the pt's Vumerity sent in to them. Please advise.

## 2021-11-13 ENCOUNTER — Ambulatory Visit: Payer: Medicaid Other | Admitting: Neurology

## 2021-11-27 DIAGNOSIS — Z0271 Encounter for disability determination: Secondary | ICD-10-CM

## 2022-10-02 ENCOUNTER — Other Ambulatory Visit: Payer: Self-pay | Admitting: Neurology

## 2022-10-02 DIAGNOSIS — G35 Multiple sclerosis: Secondary | ICD-10-CM

## 2022-10-22 ENCOUNTER — Other Ambulatory Visit: Payer: Self-pay | Admitting: Neurology

## 2022-10-22 DIAGNOSIS — G35 Multiple sclerosis: Secondary | ICD-10-CM

## 2022-10-22 DIAGNOSIS — G35D Multiple sclerosis, unspecified: Secondary | ICD-10-CM

## 2022-10-23 NOTE — Telephone Encounter (Signed)
Pt last seen on 05/07/21 No follow up scheduled

## 2022-11-13 ENCOUNTER — Other Ambulatory Visit: Payer: Self-pay | Admitting: Neurology

## 2022-11-13 DIAGNOSIS — G35 Multiple sclerosis: Secondary | ICD-10-CM

## 2022-11-13 NOTE — Telephone Encounter (Signed)
Last seen on 05/07/21 No follow up scheduled

## 2022-11-20 MED ORDER — VUMERITY 231 MG PO CPDR: DELAYED_RELEASE_CAPSULE | ORAL | 0 refills | Status: DC

## 2022-11-20 NOTE — Telephone Encounter (Signed)
Dispensing pharmacy called wanting to know if they can send out the pt's Vumerity [phone] Kara Pacer

## 2022-12-17 ENCOUNTER — Encounter: Payer: Self-pay | Admitting: Neurology

## 2022-12-17 ENCOUNTER — Other Ambulatory Visit: Payer: Self-pay | Admitting: Neurology

## 2023-05-07 NOTE — Progress Notes (Deleted)
 NEUROLOGY CONSULTATION NOTE  Morgan Allen MRN: [id number] DOB: 1980-06-11  Referring provider: Kirstie Peri, MD Primary care provider: Tresa Res, FNP  Reason for consult:  multiple sclerosis  Assessment/Plan:   ***   Subjective:  Morgan Allen is a 43 year old ***-handed female with psoriasic arthritis, HTN, anxiety, depression and type 2 diabetes who presents for multiple sclerosis.  History supplemented by prior neurologist's notes and referring provider's note.  In June 2018, she developed slurred speech and ataxia.  She was admitted to the hospital where MRI of brain reportedly abnormal.  She underwent two lumbar punctures that were reportedly inconclusive.  She was seen by neurosurgery for potential brain biopsy but she declined.  She was treated with IV steroids and improved.  A year later, she again developed slurred speech as well as facial droop.  She was seen at [facility] where MRI showed periventricular and juxtacortical white matter lesions consistent with MS.  Over the next couple of years she would experience episodes of various symptoms such as left facial numbness, generalized stabbing body pain, paresthesias and dysesthesias in the limbs and scalp, fatigue and heat intolerance.  She subsequently followed up with a Dr. Epimenio Foot at [facility] in May 2021.  Repeat MRI of the brain in June 2021 revealed progression of white matter foci including some enhancing lesions confirming MS.  She was started on Vumerity.  She continued it during her pregnancy in 2022-2023.    Current DMT:  none Past DMT:  Vumerity  Current medications:  Lexapro 10mg  daily, Abilify 15mg  at bedtime, Xanax 1mg  BID PRN (anxiety), Farxiga, losartan-hydrochlorothiazide, Novolin, metformin, Ozempic Past medications:  gabapentin (brain fog), sertraline, ropinirole (for RLS), tramadol (caused headache)  Vision:  *** Motor:  *** Sensory:  *** Pain:  1. Polyarthralgia  (psoriasis); 2. 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  Did not respond to [facility].  Discussed surgery with Dr. Conchita Paris *** Gait:  *** Bowel/Bladder:  *** Fatigue:  *** Cognition:  *** Mood:  Depression.  Anxiety. Other:  Has insomnia and RLS.  Did not respond to ropinirole.    Imaging: 02/17/2020 MRI BRAIN W WO:  1.   T2/flair hyperintense foci in the hemispheres and left pons in a pattern and configuration consistent with chronic demyelinating plaque associated with multiple sclerosis.  None of the foci enhance or appear to be acute.  Compared to the MRI dated 08/11/2019, there are no new lesions. 2.   Normal enhancement pattern and no acute findings. 08/11/2019 MRI BRAIN W WO:  1. T2/FLAIR hyperintense foci in the left pons/middle cerebellar peduncle and in the hemispheres in a pattern configuration consistent with demyelinating plaque associated with multiple sclerosis. There is subtle enhancement of the focus in the pons and enhancement of 3 other foci in the hemispheres consistent with more acute plaque. Additionally, at least 4 other foci in the hemispheres are noted on the current MRI and not present on the previous MRI from 2019.  08/11/2019 MRI C-SPINE W WO:  1.   T2 hyperintense focus within the spinal cord anteriorly adjacent to C5.  Though nonspecific, this is consistent with demyelination from multiple sclerosis.  It is unchanged compared to the 2019 MRI. 2.   Disc extrusion at C6-C7 with caudal extension causing moderate spinal stenosis.  It appears unchanged compared to the 2019 MRI. 3.   There is a normal enhancement pattern. 10/28/2018 MRI T-SPINE:  Abnormal signal adjacent to the T10 and T11 vertebral bodies  and some mild marrow edema at T10-T11 that was further evaluated with a CT scan that did not show any evidence of osteomyelitis. 09/21/2017 MRI BRAIN W WO:  1. Greater than 10 supratentorial lesions in a distribution compatible with demyelination. No identified enhancement though  post gadolinium sequences are moderately motion degraded. 2. No parenchymal brain volume loss for age. 09/21/2017 MRI C-SPINE W WO:  1. Moderately motion degraded examination. 2. Spinal cord chronic demyelinating plaque at C5 versus artifact, no enhancement to suggest acute demyelination. 3. Large C6-7 disc extrusion resulting in cord deformity and moderate canal stenosis. 4. Mild C5-6 neural foraminal narrowing versus artifact.   Labs: 01/31/2020 NMO IgG abs negative, VZV IgG 377, pan-ANCA negative, ferritin 45 07/22/2019 JCV ab positive with index 3.39.  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HCC)     PAST SURGICAL HISTORY: Past Surgical History:  Procedure Laterality Date   CESAREAN SECTION  2004    MEDICATIONS: Current Outpatient Medications on File Prior to Visit  Medication Sig Dispense Refill   acetaminophen (TYLENOL) 325 MG tablet Take 650 mg by mouth every 6 (six) hours as needed for mild pain or fever.     ARIPiprazole (ABILIFY) 10 MG tablet Take 10 mg by mouth daily.     cephALEXin (KEFLEX) 500 MG capsule Take 500 mg by mouth daily.     escitalopram (LEXAPRO) 10 MG tablet Take 10 mg by mouth daily.     Insulin Aspart, w/Niacinamide, (FIASP [place]) Inject 5 Units into the skin with breakfast, with lunch, and with evening meal.     metFORMIN (GLUCOPHAGE) 1000 MG tablet Take 0.5 tablets (500 mg total) by mouth 2 (two) times daily. 60 tablet 0   NIFEdipine (PROCARDIA-XL/NIFEDICAL-XL) 30 MG 24 hr tablet Take 30 mg by mouth daily.     Prenatal Vit-Fe Fumarate-FA (PRENATAL MULTIVITAMIN) TABS tablet Take 1 tablet by mouth daily at 12 noon.     VUMERITY 231 MG CPDR TAKE 2 CAPSULES (462 MG) BY MOUTH TWICE A DAY 120 capsule 0   No current facility-administered medications on file  prior to visit.    ALLERGIES: Allergies  Allergen Reactions   Other Itching    Grass pollen   Tramadol Other (See Comments)    Migraine     FAMILY HISTORY: Family History  Problem Relation Age of Onset   Diabetes Father    Hypertension Father    Stroke Father    High Cholesterol Father    Cancer Paternal Grandmother    Kidney disease Mother     Objective:  *** General: No acute distress.  Patient appears well-groomed.   Head:  Normocephalic/atraumatic Eyes:  fundi examined but not visualized Neck: supple, no paraspinal tenderness, full range of motion Back: No paraspinal tenderness Heart: regular rate and rhythm Lungs: Clear to auscultation bilaterally. Vascular: No carotid bruits. Neurological Exam: Mental status: alert and oriented to person, place, and time, speech fluent and not dysarthric, language intact. Cranial nerves: CN I: not tested CN II: pupils equal, round and reactive to light, visual fields intact CN III, IV, VI:  full range of motion, no nystagmus, no ptosis CN V: facial sensation intact. CN VII: upper and lower face symmetric CN VIII: hearing intact CN IX, X: gag intact,  uvula midline CN XI: sternocleidomastoid and trapezius muscles intact CN XII: tongue midline Bulk & Tone: normal, no fasciculations. Motor:  muscle strength 5/5 throughout Sensation:  Pinprick, temperature and vibratory sensation intact. Deep Tendon Reflexes:  2+ throughout,  toes downgoing.   Finger to nose testing:  Without dysmetria.   Heel to shin:  Without dysmetria.   Gait:  Normal station and stride.  Romberg negative.    Thank you for allowing me to take part in the care of this patient.  Shon Millet, DO  CC: ***

## 2023-05-08 ENCOUNTER — Ambulatory Visit: Payer: BC Managed Care – PPO | Admitting: Neurology

## 2023-09-23 NOTE — Progress Notes (Unsigned)
 NEUROLOGY CONSULTATION NOTE  Morgan Allen MRN: [id number] DOB: 01/01/1981  Referring provider: Eligio Fairly, MD Primary care provider: [place], FNP  Reason for consult:  multiple sclerosis  Assessment/Plan:   Multiple sclerosis Chronic daily headaches, cervicogenic and/or medication overuse Neurogenic bladder   DMT:  She did well on Vumerity .  As she is now on Medicaid, she will unlikely be approved for Vumerity .  Will plan to start dimethyl fumarate. For treatment of headache, neck pain and generalized pain, will start gabapentin  100mg  three times daily For further evaluation and management of neurogenic bladder, refer to urology Advised to discontinue oxycodone .  Use Tylenol  or ibuprofen  for headache, but limit to no more than 9 days out of the month. Check MRI of brain/cervical/thoracic spine with and without contrast Check labs:  CBC with diff, CMP, vit D today and in 6 months.   Subjective:  Morgan Allen is a 43 year old left-handed female with psoriasis, HTN, DM 2, Bipolar disorder, depression and anxiety who presents to establish care for multiple sclerosis.  History supplemented by prior neurologist's and referring provider's notes.  In June 2018, she began experiencing slurred speech and ataxia  She was found to have a brain MRI concerning for MS.  She underwent 2 lumbar punctures that were reportedly inconclusive. S he was treated with IV steroids and improved.  In 2019, she had another episode of slurred speech as well as facial droop. She was admitted to [facility] were MRIs revealed periventricular and juxtacortical lesions compatible with MS. She didn't follow up with neurology or start a DMT.  In April 2021, she began experiencing left sided facial numbness.  She established care with neurologist Dr. Vear the following month.  Repeat MRIs revealed additional lesions, some enhancing, confirming diagnosis of MS.  She was started on Vumerity .  She continued  Vumerity , including through her pregnancy in 2022-2023.    She left her previous neurologist and hasn't been on the Vumerity  since January.  Current symptoms occur daily Vision:  intermittent blurred vision.  Last eye exam 2 years ago Motor:  Generalized weakness.  Body feels like jello Sensory:  Tongue feels burnt, creepy crawly feeling Pain:  stabbing pain in thighs and wrists. Treats with oxycodone  Gait:  pain and unsteady when walks. Bowel/Bladder:  bladder incontinence.  Sudden after having urge. Fatigue:  some Cognition:  Trouble in concentration. Mood:  Insomnia.  Panic attacks.   Headache:  She has a persistent chronic pounding occipital headache, presumably cervicogenic (has C6-7 disc extrusion causing spinal stenosis).  No nausea, photophobia, phonophobia.  Since stopping Vumerity  in January, she also has a constant frontal headache as well.  Treats with oxycodone , Tylenol , ibuprofen . Takes them daily (or almost daily).  Dulls headache a little bit.   Neck pain:  Has C6-7 disc extrusion causing spinal stenosis.  PT ineffective.  Epidural injection made it worse.  She saw neurosurgery and declined surgery.   Other:  tongue moves back and forth and cannot control it  Current DMT: Vumerity  none Past DMT:  Vumerity  (2021-Jan 2025)  Current medications:  Abilify 15mg  at bedtime, Lexapro 10mg  daily, Pristiq 50mg  daily, Tylenol , ibuprofen , oxycodone  5mg  TID, Xanax 1mg  BID PRN, Ozempic, metformin  Past medications: Ropinorole   Imaging: 02/17/2020 MRI BRAIN W WO:  1.   T2/flair hyperintense foci in the hemispheres and left pons in a pattern and configuration consistent with chronic demyelinating plaque associated with multiple sclerosis.  None of the foci enhance or appear to  be acute.  Compared to the MRI dated 08/11/2019, there are no new lesions. 2.   Normal enhancement pattern and no acute findings. 08/11/2019 MRI BRAIN W WO:  T2/FLAIR hyperintense foci in the left pons/middle  cerebellar peduncle and in the hemispheres in a pattern configuration consistent with demyelinating plaque associated with multiple sclerosis. There is subtle enhancement of the focus in the pons and enhancement of 3 other foci in the hemispheres consistent with more acute plaque. Additionally, at least 4 other foci in the hemispheres are noted on the current MRI and not present on the previous MRI from 2019.  08/11/2019 MRI C-SPINE W WO:  1.   T2 hyperintense focus within the spinal cord anteriorly adjacent to C5.  Though nonspecific, this is consistent with demyelination from multiple sclerosis.  It is unchanged compared to the 2019 MRI. 2.   Disc extrusion at C6-C7 with caudal extension causing moderate spinal stenosis.  It appears unchanged compared to the 2019 MRI. 3.   There is a normal enhancement pattern. 09/21/2017 MRI BRAIN W WO:  1. Greater than 10 supratentorial lesions in a distribution compatible with demyelination. No identified enhancement though post gadolinium sequences are moderately motion degraded. 2. No parenchymal brain volume loss for age. 09/21/2017 MRI C-SPINE W WO:  1. Moderately motion degraded examination. 2. Spinal cord chronic demyelinating plaque at C5 versus artifact, no enhancement to suggest acute demyelination. 3. Large C6-7 disc extrusion resulting in cord deformity and moderate canal stenosis. 4. Mild C5-6 neural foraminal narrowing versus artifact.  Prior labs:  NMO IgG Abs negative, VZV IgG 377, pan-ANCA panel negative,   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HCC)     PAST SURGICAL HISTORY: Past Surgical History:  Procedure Laterality Date   CESAREAN SECTION  2004    MEDICATIONS: Current Outpatient Medications on File Prior to Visit  Medication Sig  Dispense Refill   acetaminophen  (TYLENOL ) 325 MG tablet Take 650 mg by mouth every 6 (six) hours as needed for mild pain or fever.     ARIPiprazole (ABILIFY) 10 MG tablet Take 10 mg by mouth daily.     cephALEXin (KEFLEX) 500 MG capsule Take 500 mg by mouth daily.     escitalopram (LEXAPRO) 10 MG tablet Take 10 mg by mouth daily.     Insulin Aspart, w/Niacinamide, (FIASP [place]) Inject 5 Units into the skin with breakfast, with lunch, and with evening meal.     metFORMIN  (GLUCOPHAGE ) 1000 MG tablet Take 0.5 tablets (500 mg total) by mouth 2 (two) times daily. 60 tablet 0   NIFEdipine (PROCARDIA-XL/NIFEDICAL-XL) 30 MG 24 hr tablet Take 30 mg by mouth daily.     Prenatal Vit-Fe Fumarate-FA (PRENATAL MULTIVITAMIN) TABS tablet Take 1 tablet by mouth daily at 12 noon.     VUMERITY  231 MG CPDR TAKE 2 CAPSULES (462 MG) BY MOUTH TWICE A DAY 120 capsule 0   No current facility-administered medications on file prior to visit.    ALLERGIES: Allergies  Allergen Reactions   Other Itching    Grass pollen   Tramadol Other (See Comments)    Migraine     FAMILY HISTORY: Family History  Problem Relation Age of Onset   Diabetes Father    Hypertension Father  Stroke Father    High Cholesterol Father    Cancer Paternal Grandmother    Kidney disease Mother     Objective:  Blood pressure 123/82, pulse (!) 112, weight 233 lb (105.7 kg). General: No acute distress.  Patient appears well-groomed.   Head:  Normocephalic/atraumatic Eyes:  fundi examined but not visualized Neck: supple, no paraspinal tenderness, full range of motion Heart: regular rate and rhythm Neurological Exam: Mental status: alert and oriented to person, place, and time, speech fluent and not dysarthric, language intact. Cranial nerves: CN I: not tested CN II: pupils equal, round and reactive to light, visual fields intact CN III, IV, VI:  full range of motion, no nystagmus, no ptosis CN V: facial sensation intact. CN VII:  upper and lower face symmetric CN VIII: hearing intact CN IX, X: gag intact, uvula midline CN XI: sternocleidomastoid and trapezius muscles intact CN XII: tongue midline Bulk & Tone: normal, no fasciculations. Motor:  muscle strength 5/5 throughout Sensation:  Pinprick, temperature and vibratory sensation intact. Deep Tendon Reflexes:  2+ throughout,  toes downgoing.   Finger to nose testing:  Without dysmetria.   Heel to shin:  Without dysmetria.   Gait:  Antalgic.  Able to tandem walk.  Romberg negative.    Thank you for allowing me to take part in the care of this patient.  Morgan Dunnings, DO  CC:  Morgan Mines, FNP  Morgan Fairly, MD

## 2023-09-24 ENCOUNTER — Other Ambulatory Visit

## 2023-09-24 ENCOUNTER — Ambulatory Visit: Admitting: Neurology

## 2023-09-24 ENCOUNTER — Encounter: Payer: Self-pay | Admitting: Neurology

## 2023-09-24 VITALS — BP 123/82 | HR 112 | Wt 233.0 lb

## 2023-09-24 DIAGNOSIS — N319 Neuromuscular dysfunction of bladder, unspecified: Secondary | ICD-10-CM | POA: Diagnosis not present

## 2023-09-24 DIAGNOSIS — M4802 Spinal stenosis, cervical region: Secondary | ICD-10-CM

## 2023-09-24 DIAGNOSIS — G4440 Drug-induced headache, not elsewhere classified, not intractable: Secondary | ICD-10-CM | POA: Diagnosis not present

## 2023-09-24 DIAGNOSIS — G35 Multiple sclerosis: Secondary | ICD-10-CM

## 2023-09-24 LAB — CBC WITH DIFFERENTIAL/PLATELET
Absolute Lymphocytes: 1950 {cells}/uL (ref 850–3900)
Absolute Monocytes: 601 {cells}/uL (ref 200–950)
Basophils Absolute: 39 {cells}/uL (ref 0–200)
Basophils Relative: 0.4 %
Eosinophils Absolute: 97 {cells}/uL (ref 15–500)
Eosinophils Relative: 1 %
HCT: 43.1 % (ref 35.0–45.0)
Hemoglobin: 14 g/dL (ref 11.7–15.5)
MCH: 27.4 pg (ref 27.0–33.0)
MCHC: 32.5 g/dL (ref 32.0–36.0)
MCV: 84.3 fL (ref 80.0–100.0)
MPV: 9.4 fL (ref 7.5–12.5)
Monocytes Relative: 6.2 %
Neutro Abs: 7013 {cells}/uL (ref 1500–7800)
Neutrophils Relative %: 72.3 %
Platelets: 365 Thousand/uL (ref 140–400)
RBC: 5.11 Million/uL — ABNORMAL HIGH (ref 3.80–5.10)
RDW: 15.7 % — ABNORMAL HIGH (ref 11.0–15.0)
Total Lymphocyte: 20.1 %
WBC: 9.7 Thousand/uL (ref 3.8–10.8)

## 2023-09-24 LAB — COMPREHENSIVE METABOLIC PANEL WITH GFR
AG Ratio: 1.4 (calc) (ref 1.0–2.5)
ALT: 14 U/L (ref 6–29)
AST: 11 U/L (ref 10–30)
Albumin: 4.3 g/dL (ref 3.6–5.1)
Alkaline phosphatase (APISO): 82 U/L (ref 31–125)
BUN/Creatinine Ratio: 33 (calc) — ABNORMAL HIGH (ref 6–22)
BUN: 16 mg/dL (ref 7–25)
CO2: 26 mmol/L (ref 20–32)
Calcium: 9.8 mg/dL (ref 8.6–10.2)
Chloride: 104 mmol/L (ref 98–110)
Creat: 0.49 mg/dL — ABNORMAL LOW (ref 0.50–0.99)
Globulin: 3.1 g/dL (ref 1.9–3.7)
Glucose, Bld: 141 mg/dL — ABNORMAL HIGH (ref 65–99)
Potassium: 4.1 mmol/L (ref 3.5–5.3)
Sodium: 139 mmol/L (ref 135–146)
Total Bilirubin: 0.3 mg/dL (ref 0.2–1.2)
Total Protein: 7.4 g/dL (ref 6.1–8.1)
eGFR: 119 mL/min/1.73m2 (ref >=60)

## 2023-09-24 LAB — VITAMIN D 25 HYDROXY (VIT D DEFICIENCY, FRACTURES): Vit D, 25-Hydroxy: 31 ng/mL (ref 30–100)

## 2023-09-24 MED ORDER — GABAPENTIN 100 MG PO CAPS: 100.0000 mg | ORAL_CAPSULE | Freq: Three times a day (TID) | ORAL | 5 refills | Status: DC

## 2023-09-24 NOTE — Patient Instructions (Addendum)
 Plan to start dimethyl fumarate Check CBC with diff, CMP, vit D today and again in 6 months Check MRI of brain, cervical and thoracic spine with and without contrast . We have sent a referral to [facility] for your MRI and they will call you directly to schedule your appointment. They are located at [address] [facility]. If you need to contact them directly please call [phone].  Start gabapentin  100mg  three times daily Stop oxycodone .  Limit use of Tylenol  or ibuprofen  to no more than 9 days out of the month Refer to urology for neurogenic bladder Follow up 6 months.

## 2023-09-26 ENCOUNTER — Ambulatory Visit: Payer: Self-pay | Admitting: Neurology

## 2023-09-26 NOTE — Progress Notes (Signed)
 Patient advised of her lab results.   Patient wants when will she know what medication she will be starting.

## 2023-09-26 NOTE — Progress Notes (Signed)
 Tried calling patient no answer.

## 2023-09-29 ENCOUNTER — Encounter: Payer: Self-pay | Admitting: Neurology

## 2023-09-29 ENCOUNTER — Other Ambulatory Visit: Payer: Self-pay | Admitting: Neurology

## 2023-10-01 ENCOUNTER — Other Ambulatory Visit: Payer: Self-pay | Admitting: Neurology

## 2023-10-01 MED ORDER — DIMETHYL FUMARATE 240 MG PO CPDR: 1.0000 | DELAYED_RELEASE_CAPSULE | Freq: Two times a day (BID) | ORAL | 5 refills | Status: DC

## 2023-10-01 MED ORDER — DIMETHYL FUMARATE 120 MG PO CPDR: DELAYED_RELEASE_CAPSULE | ORAL | 0 refills | Status: AC

## 2023-10-19 ENCOUNTER — Ambulatory Visit
Admission: RE | Admit: 2023-10-19 | Discharge: 2023-10-19 | Disposition: A | Discharge status: Home / Self Care | Source: Ambulatory Visit | Attending: Neurology | Admitting: Neurology

## 2023-10-19 DIAGNOSIS — G35 Multiple sclerosis: Secondary | ICD-10-CM

## 2023-10-19 MED ORDER — GADOPICLENOL 0.5 MMOL/ML IV SOLN
10.0000 mL | Freq: Once | INTRAVENOUS | Status: AC | PRN
  Administered 2023-10-19: 10 mL via INTRAVENOUS

## 2023-10-24 ENCOUNTER — Ambulatory Visit
Admission: RE | Admit: 2023-10-24 | Discharge: 2023-10-24 | Disposition: A | Discharge status: Home / Self Care | Source: Ambulatory Visit | Attending: Neurology | Admitting: Neurology

## 2023-10-24 DIAGNOSIS — G35 Multiple sclerosis: Secondary | ICD-10-CM

## 2023-10-24 MED ORDER — GADOPICLENOL 0.5 MMOL/ML IV SOLN
10.0000 mL | Freq: Once | INTRAVENOUS | Status: AC | PRN
  Administered 2023-10-24: 10 mL via INTRAVENOUS

## 2024-03-25 ENCOUNTER — Other Ambulatory Visit: Payer: Self-pay | Admitting: Neurology

## 2024-03-26 ENCOUNTER — Telehealth: Payer: Self-pay

## 2024-03-26 NOTE — Telephone Encounter (Signed)
 PA needed for Dimethyl Fumarate 

## 2024-03-30 ENCOUNTER — Other Ambulatory Visit (HOSPITAL_COMMUNITY): Payer: Self-pay

## 2024-03-30 ENCOUNTER — Telehealth: Payer: Self-pay | Admitting: Pharmacy Technician

## 2024-03-30 NOTE — Telephone Encounter (Signed)
 Pharmacy Patient Advocate Encounter  Received notification from [facility] MEDICAID that Prior Authorization for DIMETHLY FUMARATE 240MG  has been CANCELLED due to    Last filled on 1.14.26. Next earliest fill date is 2.5.26 per insurance

## 2024-03-30 NOTE — Telephone Encounter (Signed)
 PA has been submitted, and telephone encounter has been created. Please see telephone encounter dated 2.3.26.

## 2024-03-31 NOTE — Progress Notes (Unsigned)
 "  NEUROLOGY FOLLOW UP OFFICE NOTE  Morgan Allen [id number]  Assessment/Plan:   Multiple sclerosis Chronic pain    DMT:  dimethyl furaramte 120mg  twice daily For treatment of generalized shooting pain,and burning tongue, start pregablin 75mg  twice daily Check MRI of brain and cervical spine with and without contrast in 6 months. Check labs:  CBC with diff and vitamin D  today and in 6 months Follow up in 6 months (about 2 weeks after repeat MRI)   Subjective:  Morgan Allen is a 43 year old left-handed female with psoriasis, HTN, DM 2, Bipolar disorder, depression and anxiety who follows up for multiple sclerosis.  MRIs personally reviewed.  UPDATE: Current DMT:  dimethyl fumarate  120mg  twice daily (started August 2025) Current medications:  Abilify 15mg  at bedtime, Lexapro 10mg  daily, Pristiq 50mg  daily, Tylenol , ibuprofen , oxycodone  5mg  TID, Xanax 1mg  BID PRN, Ozempic, metformin   09/24/2023 LABS:  CBC with WBC 9.7, HGB 14, HCT 43.1, PLT 365, ALC 1950; CMP with Na 139, K 4.1, Cl 104, Co2 26, Ca 9.8, glucose 141, BUN 16, Cr 0.49, t bili 0.3, ALP 82, AST 11, ALT 14; vit D 31.  Advised to D3 2000 I U daily.   10/19/2023 MRI BRAIN W WO:  Unchanged multiple hyperintense T2-weighted signal foci within the left greater than right supratentorial white matter, compatible with chronic demyelinating disease. No enhancing lesions. 10/24/2023 MRI C-SPINE W WO:  Unchanged chronic demyelinating lesion of the cervical spinal cord at C5.  Unchanged large central disc extrusion with inferior migration at C6-7 causing moderate spinal canal stenosis and indenting the ventral spinal cord.  Mild right foraminal stenosis at C3-4, mild left foraminal stenosis at C4-5 and C5-6 due to uncovertebral spurring. 10/19/2023 MRI T-SPINE W WO:  Normal MRI of the thoracic spine. No evidence for demyelinating disease.  Current symptoms occur daily Vision:  intermittent blurred vision.  Last eye exam 2.5 years ago.   Didn't make an appointment.   Motor:  Generalized weakness.  Body feels like jello Sensory:  creepy crawly feeling Pain:  Stopped gabapentin  after a month because it increased headaches; stabbing pain in thighs and wrists and burning pain more frequent. Taking oxycodone  sparingly. Gait:  pain and unsteady when walks. Bowel/Bladder:  bladder incontinence.  Sudden after having urge.  Referred to urology but she didn't follow up because it has improved.   Fatigue:  some Cognition:  Trouble in concentration. Mood:  Insomnia.  Panic attacks.   Headache:  She has a persistent chronic pounding occipital headache, presumably cervicogenic (has C6-7 disc extrusion causing spinal stenosis).  No nausea, photophobia, phonophobia.  Since stopping Vumerity  in January, she also has a constant frontal headache as well.  Treats with oxycodone , Tylenol , ibuprofen . Takes them daily (or almost daily).  Dulls headache a little bit.   Neck pain:  Has C6-7 disc extrusion causing spinal stenosis.  PT ineffective.  Epidural injection made it worse.  She saw neurosurgery and declined surgery.   Other:  tongue moves back and forth and cannot control it  HISTORY: In June 2018, she began experiencing slurred speech and ataxia  She was found to have a brain MRI concerning for MS.  She underwent 2 lumbar punctures that were reportedly inconclusive. S he was treated with IV steroids and improved.  In 2019, she had another episode of slurred speech as well as facial droop. She was admitted to [facility] were MRIs revealed periventricular and juxtacortical lesions compatible with MS. She didn't follow  up with neurology or start a DMT.  In April 2021, she began experiencing left sided facial numbness.  She established care with neurologist Dr. Vear the following month.  Repeat MRIs revealed additional lesions, some enhancing, confirming diagnosis of MS.  She was started on Vumerity .  She continued Vumerity , including  through her pregnancy in 2022-2023.      Past DMT:  Vumerity  (2021-Jan 2025) Past medications: Ropinorole, gabapentin  (worsened headache)   Imaging: 02/17/2020 MRI BRAIN W WO:  1.   T2/flair hyperintense foci in the hemispheres and left pons in a pattern and configuration consistent with chronic demyelinating plaque associated with multiple sclerosis.  None of the foci enhance or appear to be acute.  Compared to the MRI dated 08/11/2019, there are no new lesions. 2.   Normal enhancement pattern and no acute findings. 08/11/2019 MRI BRAIN W WO:  T2/FLAIR hyperintense foci in the left pons/middle cerebellar peduncle and in the hemispheres in a pattern configuration consistent with demyelinating plaque associated with multiple sclerosis. There is subtle enhancement of the focus in the pons and enhancement of 3 other foci in the hemispheres consistent with more acute plaque. Additionally, at least 4 other foci in the hemispheres are noted on the current MRI and not present on the previous MRI from 2019.  08/11/2019 MRI C-SPINE W WO:  1.   T2 hyperintense focus within the spinal cord anteriorly adjacent to C5.  Though nonspecific, this is consistent with demyelination from multiple sclerosis.  It is unchanged compared to the 2019 MRI. 2.   Disc extrusion at C6-C7 with caudal extension causing moderate spinal stenosis.  It appears unchanged compared to the 2019 MRI. 3.   There is a normal enhancement pattern. 09/21/2017 MRI BRAIN W WO:  1. Greater than 10 supratentorial lesions in a distribution compatible with demyelination. No identified enhancement though post gadolinium sequences are moderately motion degraded. 2. No parenchymal brain volume loss for age. 09/21/2017 MRI C-SPINE W WO:  1. Moderately motion degraded examination. 2. Spinal cord chronic demyelinating plaque at C5 versus artifact, no enhancement to suggest acute demyelination. 3. Large C6-7 disc extrusion resulting in cord deformity and moderate  canal stenosis. 4. Mild C5-6 neural foraminal narrowing versus artifact.  Prior labs:  NMO IgG Abs negative, VZV IgG 377, pan-ANCA panel negative  PAST MEDICAL HISTORY: Past Medical History:  Diagnosis Date   Diabetes mellitus with hyperglycemia (HCC)    Dizziness    Gestational diabetes    Joint pain    MS (multiple sclerosis)    Obesity    Pregnancy induced hypertension    Pregnant    Psoriasis arthropathica (HCC)     MEDICATIONS: Medications Ordered Prior to Encounter[1]  ALLERGIES: Allergies[2]  FAMILY HISTORY: Family History  Problem Relation Age of Onset   Kidney disease Mother    Diabetes Father    Hypertension Father    Stroke Father    High Cholesterol Father    Multiple sclerosis Paternal Grandmother    Cancer Paternal Grandmother       Objective:  Blood pressure 120/78, pulse 100, height 5' 1 (1.549 m), weight 237 lb 9.6 oz (107.8 kg), SpO2 97%. General: No acute distress.  Patient appears well-groomed.   Head:  Normocephalic/atraumatic Eyes:  Fundi examined but not visualized Neck: supple, no paraspinal tenderness, full range of motion Heart:  Regular rate and rhythm Neurological Exam: alert and oriented.  Speech fluent and not dysarthric, language intact.  CN II-XII intact. Bulk and tone normal, muscle strength  5/5 throughout.  Sensation to temperature and vibration intact.  Deep tendon reflexes 2+ throughout, toes downgoing.  Finger to nose testing intact.  Gait normal, Romberg negative.   Juliene Dunnings, DO  CC: [place], FNP          [1]  Current Outpatient Medications on File Prior to Visit  Medication Sig Dispense Refill   acetaminophen  (TYLENOL ) 325 MG tablet Take 650 mg by mouth every 6 (six) hours as needed for mild pain or fever.     ARIPiprazole (ABILIFY) 10 MG tablet Take 10 mg by mouth daily.     cephALEXin (KEFLEX) 500 MG capsule Take 500 mg by mouth daily.     desvenlafaxine (PRISTIQ) 50 MG 24 hr tablet Take 50 mg by mouth  daily.     Dimethyl Fumarate  120 MG CPDR Take 1 capsule twice daily for 7 days ,then start the 240mg  capsule. 14 capsule 0   Dimethyl Fumarate  240 MG CPDR TAKE 1 CAPSULE BY MOUTH IN THE MORNING AND AT BEDTIME 60 capsule 0   escitalopram (LEXAPRO) 10 MG tablet Take 10 mg by mouth daily.     FARXIGA 10 MG TABS tablet Take 10 mg by mouth every morning.     gabapentin  (NEURONTIN ) 100 MG capsule Take 1 capsule (100 mg total) by mouth 3 (three) times daily. 90 capsule 5   Insulin Aspart, w/Niacinamide, (FIASP [place]) Inject 5 Units into the skin with breakfast, with lunch, and with evening meal.     metFORMIN  (GLUCOPHAGE ) 1000 MG tablet Take 0.5 tablets (500 mg total) by mouth 2 (two) times daily. 60 tablet 0   NIFEdipine (PROCARDIA-XL/NIFEDICAL-XL) 30 MG 24 hr tablet Take 30 mg by mouth daily.     OZEMPIC, 2 MG/DOSE, 8 MG/3ML SOPN Inject 2 mg into the skin once a week.     Prenatal Vit-Fe Fumarate-FA (PRENATAL MULTIVITAMIN) TABS tablet Take 1 tablet by mouth daily at 12 noon. (Patient not taking: Reported on 09/24/2023)     VUMERITY  231 MG CPDR TAKE 2 CAPSULES (462 MG) BY MOUTH TWICE A DAY (Patient not taking: Reported on 09/24/2023) 120 capsule 0   No current facility-administered medications on file prior to visit.  [2]  Allergies Allergen Reactions   Other Itching    Grass pollen   Tramadol Other (See Comments)    Migraine    "

## 2024-04-02 ENCOUNTER — Other Ambulatory Visit

## 2024-04-02 ENCOUNTER — Ambulatory Visit (INDEPENDENT_AMBULATORY_CARE_PROVIDER_SITE_OTHER): Admitting: Neurology

## 2024-04-02 ENCOUNTER — Encounter: Payer: Self-pay | Admitting: Neurology

## 2024-04-02 VITALS — BP 120/78 | HR 100 | Ht 61.0 in | Wt 237.6 lb

## 2024-04-02 DIAGNOSIS — G35D Multiple sclerosis, unspecified: Secondary | ICD-10-CM

## 2024-04-02 DIAGNOSIS — G894 Chronic pain syndrome: Secondary | ICD-10-CM

## 2024-04-02 LAB — CBC WITH DIFFERENTIAL/PLATELET
Absolute Lymphocytes: 734 {cells}/uL — ABNORMAL LOW (ref 850–3900)
Absolute Monocytes: 476 {cells}/uL (ref 200–950)
Basophils Absolute: 22 {cells}/uL (ref 0–200)
Basophils Relative: 0.4 %
Eosinophils Absolute: 90 {cells}/uL (ref 15–500)
Eosinophils Relative: 1.6 %
HCT: 39.8 % (ref 35.9–46.0)
Hemoglobin: 13.1 g/dL (ref 11.7–15.5)
MCH: 26.9 pg — ABNORMAL LOW (ref 27.0–33.0)
MCHC: 32.9 g/dL (ref 31.6–35.4)
MCV: 81.7 fL (ref 81.4–101.7)
MPV: 9.3 fL (ref 7.5–12.5)
Monocytes Relative: 8.5 %
Neutro Abs: 4278 {cells}/uL (ref 1500–7800)
Neutrophils Relative %: 76.4 %
Platelets: 295 10*3/uL (ref 140–400)
RBC: 4.87 Million/uL (ref 3.80–5.10)
RDW: 14.6 % (ref 11.0–15.0)
Total Lymphocyte: 13.1 %
WBC: 5.6 10*3/uL (ref 3.8–10.8)

## 2024-04-02 LAB — VITAMIN D 25 HYDROXY (VIT D DEFICIENCY, FRACTURES): Vit D, 25-Hydroxy: 29 ng/mL — ABNORMAL LOW (ref 30–100)

## 2024-04-02 MED ORDER — PREGABALIN 75 MG PO CAPS: 75.0000 mg | ORAL_CAPSULE | Freq: Two times a day (BID) | ORAL | 5 refills | Status: AC

## 2024-04-02 NOTE — Patient Instructions (Addendum)
 Dimethyl fumarate  120mg  twice daily Vit D3 2000 I U daily Start pregablin 75mg  twice daily for pain Check CBC with diff and vit D today and again in 6 months Repeat MRI of brain and cervical spine with and without contrast in 6 months Follow up 6 months (about 2 weeks after repeat MRI)

## 2024-09-28 ENCOUNTER — Other Ambulatory Visit

## 2024-10-21 ENCOUNTER — Ambulatory Visit: Payer: Self-pay | Admitting: Neurology
